# Patient Record
Sex: Female | Born: 1985 | Race: Black or African American | Hispanic: No | Marital: Single | Smoking: Former smoker
Health system: Southern US, Community
[De-identification: ages and names within clinical notes are randomized; demographics above are authoritative.]

---

## 2008-08-07 ENCOUNTER — Emergency Department (HOSPITAL_COMMUNITY): Admission: EM | Admit: 2008-08-07 | Discharge: 2008-08-07 | Payer: Self-pay | Admitting: Emergency Medicine

## 2008-09-02 ENCOUNTER — Emergency Department (HOSPITAL_COMMUNITY): Admission: EM | Admit: 2008-09-02 | Discharge: 2008-09-02 | Payer: Self-pay | Admitting: Family Medicine

## 2008-12-02 ENCOUNTER — Emergency Department (HOSPITAL_COMMUNITY): Admission: EM | Admit: 2008-12-02 | Discharge: 2008-12-02 | Payer: Self-pay | Admitting: Emergency Medicine

## 2009-12-16 ENCOUNTER — Emergency Department (HOSPITAL_COMMUNITY): Admission: EM | Admit: 2009-12-16 | Discharge: 2009-12-16 | Payer: Self-pay | Admitting: Family Medicine

## 2010-07-24 ENCOUNTER — Emergency Department (HOSPITAL_BASED_OUTPATIENT_CLINIC_OR_DEPARTMENT_OTHER): Admission: EM | Admit: 2010-07-24 | Discharge: 2010-07-24 | Payer: Self-pay | Admitting: Emergency Medicine

## 2010-12-25 LAB — URINALYSIS, ROUTINE W REFLEX MICROSCOPIC
Glucose, UA: NEGATIVE mg/dL
Hgb urine dipstick: NEGATIVE
pH: 5 (ref 5.0–8.0)

## 2011-01-18 ENCOUNTER — Emergency Department (HOSPITAL_COMMUNITY)
Admission: EM | Admit: 2011-01-18 | Discharge: 2011-01-18 | Discharge status: Against Medical Advice | Payer: Self-pay | Attending: Emergency Medicine | Admitting: Emergency Medicine

## 2011-01-18 DIAGNOSIS — M542 Cervicalgia: Secondary | ICD-10-CM | POA: Insufficient documentation

## 2011-01-27 LAB — POCT RAPID STREP A (OFFICE): Streptococcus, Group A Screen (Direct): NEGATIVE

## 2011-03-10 ENCOUNTER — Inpatient Hospital Stay (INDEPENDENT_AMBULATORY_CARE_PROVIDER_SITE_OTHER)
Admission: RE | Admit: 2011-03-10 | Discharge: 2011-03-10 | Disposition: A | Discharge status: Home / Self Care | Payer: Self-pay | Source: Ambulatory Visit | Attending: Emergency Medicine | Admitting: Emergency Medicine

## 2011-03-10 DIAGNOSIS — L089 Local infection of the skin and subcutaneous tissue, unspecified: Secondary | ICD-10-CM

## 2011-03-13 LAB — WOUND CULTURE
Culture: NO GROWTH
Gram Stain: NONE SEEN

## 2011-07-14 LAB — POCT URINALYSIS DIP (DEVICE)
Hgb urine dipstick: NEGATIVE
Ketones, ur: NEGATIVE
Protein, ur: NEGATIVE
Specific Gravity, Urine: 1.02
pH: 6.5

## 2011-12-21 ENCOUNTER — Encounter (HOSPITAL_COMMUNITY): Payer: Self-pay

## 2011-12-21 ENCOUNTER — Emergency Department (HOSPITAL_COMMUNITY)
Admission: EM | Admit: 2011-12-21 | Discharge: 2011-12-21 | Disposition: A | Discharge status: Home Health | Payer: Managed Care, Other (non HMO) | Source: Home / Self Care

## 2011-12-21 DIAGNOSIS — K529 Noninfective gastroenteritis and colitis, unspecified: Secondary | ICD-10-CM

## 2011-12-21 DIAGNOSIS — K5289 Other specified noninfective gastroenteritis and colitis: Secondary | ICD-10-CM

## 2011-12-21 DIAGNOSIS — J069 Acute upper respiratory infection, unspecified: Secondary | ICD-10-CM

## 2011-12-21 MED ORDER — PROMETHAZINE-CODEINE 6.25-10 MG/5ML PO SYRP: ORAL_SOLUTION | ORAL | Status: AC

## 2011-12-21 NOTE — ED Provider Notes (Signed)
History     CSN: [id number]  Arrival date & time 12/21/11  1021   None     Chief Complaint  Patient presents with  . URI    (Consider location/radiation/quality/duration/timing/severity/associated sxs/prior treatment) HPI Comments: Patient states that she began 4 days ago with clear rhinorrhea and nonproductive cough. She has also had sore throat, headache and body aches. Early this morning she began with vomiting and diarrhea. She has vomited once and has had 2 loose stools. No abdominal pain, but states stomach "doesn't feel right." She denies any known sick contacts. She has tried various over-the-counter cough and cold products without any improvement. She is eating and drinking normally. She is urinating normally without any dysuria. Last menstrual period was 2 weeks ago and was normal.   Past Medical History  Diagnosis Date  . Asthma     History reviewed. No pertinent past surgical history.  History reviewed. No pertinent family history.  History  Substance Use Topics  . Smoking status: Current Everyday Smoker  . Smokeless tobacco: Not on file  . Alcohol Use: Yes    OB History    Grav Para Term Preterm Abortions TAB SAB Ect Mult Living                  Review of Systems  Constitutional: Positive for fatigue. Negative for fever and chills.  HENT: Positive for congestion, sore throat and rhinorrhea. Negative for ear pain, postnasal drip and sinus pressure.   Respiratory: Positive for cough. Negative for shortness of breath and wheezing.   Cardiovascular: Negative for chest pain.  Gastrointestinal: Positive for nausea, vomiting and diarrhea. Negative for abdominal pain.    Allergies  Penicillins  Home Medications   Current Outpatient Rx  Name Route Sig Dispense Refill  . ALBUTEROL IN Inhalation Inhale into the lungs.    Marland Kitchen PROMETHAZINE-CODEINE 6.25-10 MG/5ML PO SYRP  1-2 tsp every 6 hrs prn cough 120 mL 0    BP 113/71  Pulse 108  Temp(Src) 99.1 F (37.3  C) (Oral)  Resp 20  SpO2 100%  LMP 12/07/2011  Physical Exam  Nursing note and vitals reviewed. Constitutional: She appears well-developed and well-nourished. No distress.  HENT:  Head: Normocephalic and atraumatic.  Right Ear: Tympanic membrane, external ear and ear canal normal.  Left Ear: Tympanic membrane, external ear and ear canal normal.  Nose: Nose normal.  Mouth/Throat: Uvula is midline, oropharynx is clear and moist and mucous membranes are normal. No oropharyngeal exudate, posterior oropharyngeal edema or posterior oropharyngeal erythema.  Neck: Neck supple.  Cardiovascular: Normal rate, regular rhythm and normal heart sounds.   Pulmonary/Chest: Effort normal and breath sounds normal. No respiratory distress.  Abdominal: Soft. Bowel sounds are normal. She exhibits no distension and no mass. There is no tenderness.  Lymphadenopathy:    She has no cervical adenopathy.  Neurological: She is alert.  Skin: Skin is warm and dry.  Psychiatric: She has a normal mood and affect.    ED Course  Procedures (including critical care time)  Labs Reviewed - No data to display No results found.   1. Acute URI   2. Gastroenteritis       MDM  Viral URI symptoms x 5 days with onset of V/D today. Retaining foods and fluids.         Melody Comas, [place] 12/21/11 1255

## 2011-12-21 NOTE — ED Notes (Signed)
C/o sore throat, runny nose, nasal congestion, headache, cough, diarrhea and vomiting since last Thursday.  States drinking fluids and retaining them.

## 2011-12-21 NOTE — ED Provider Notes (Signed)
Medical screening examination/treatment/procedure(s) were performed by non-physician practitioner and as supervising physician I was immediately available for consultation/collaboration.  Eagan Shifflett M. MD   Robertta Halfhill M Lashundra Shiveley, MD 12/21/11 1331 

## 2011-12-21 NOTE — Discharge Instructions (Signed)
Tylenol or Ibuprofen as needed for discomfort. Drink clear fluids, and eat a light bland diet until your vomiting and diarrhea improve.  Return if symptoms change or worsen.

## 2012-01-21 ENCOUNTER — Encounter: Payer: Managed Care, Other (non HMO) | Admitting: Physician Assistant

## 2012-06-28 NOTE — Progress Notes (Signed)
This encounter was created in error - please disregard.

## 2012-10-19 ENCOUNTER — Telehealth: Payer: Self-pay | Admitting: Obstetrics and Gynecology

## 2012-10-19 NOTE — Telephone Encounter (Signed)
Tc to pt per telephone call. Pt c/o brown vag d/c with odor. Mild itching. Appt sched 10/20/12 @ 11:30p with AVS for eval. Pt agrees.

## 2012-10-20 ENCOUNTER — Encounter: Payer: Self-pay | Admitting: Obstetrics and Gynecology

## 2012-10-20 ENCOUNTER — Ambulatory Visit (INDEPENDENT_AMBULATORY_CARE_PROVIDER_SITE_OTHER): Payer: Managed Care, Other (non HMO) | Admitting: Obstetrics and Gynecology

## 2012-10-20 VITALS — BP 106/70 | Wt 164.0 lb

## 2012-10-20 DIAGNOSIS — Z113 Encounter for screening for infections with a predominantly sexual mode of transmission: Secondary | ICD-10-CM

## 2012-10-20 DIAGNOSIS — N898 Other specified noninflammatory disorders of vagina: Secondary | ICD-10-CM

## 2012-10-20 LAB — POCT WET PREP (WET MOUNT)
Bacteria Wet Prep HPF POC: NEGATIVE
Clue Cells Wet Prep Whiff POC: POSITIVE
WBC, Wet Prep HPF POC: NEGATIVE
pH: 5

## 2012-10-20 MED ORDER — METRONIDAZOLE 0.75 % VA GEL: 1.0000 | Freq: Two times a day (BID) | VAGINAL | Status: DC

## 2012-10-20 MED ORDER — METRONIDAZOLE 500 MG PO TABS: 500.0000 mg | ORAL_TABLET | Freq: Two times a day (BID) | ORAL | Status: DC

## 2012-10-20 NOTE — Progress Notes (Signed)
HISTORY OF PRESENT ILLNESS  [name] is a 27 y.o. year old female,No obstetric history on file., who presents for a problem visit. The patient has had a new sex partner.  She was to rule out sexual transmitted infections.  Subjective:  The patient complains of a brown discharge with a holder.  Objective:  BP 106/70  Wt 164 lb (74.39 kg)  LMP 10/05/2012   General: no distress GI: soft and nontender  External genitalia: normal general appearance Vaginal: normal without tenderness, induration or masses Cervix: normal appearance Adnexa: normal bimanual exam Uterus: normal size and shape  Wet prep: Clue cells negative, pH 5.5, Whiff positive  Assessment:  Vaginosis  Rule out sexual transmitted infections  Plan:  MetroGel vaginal 0.75% twice each day for 5 days or Metronidazole 500 mg 1 tablet twice a day for 7 days  Gonorrhea and Chlamydia sent  Return to office prn if symptoms worsen or fail to improve.   Leonard Schwartz M.D.  10/20/2012 9:21 PM    Color: Manson Passey Odor: yes Itching:yes Thin:yes Thick:no Fever:no Dyspareunia:no Hx PID:no HX STD:yes Pelvic Pain:no Desires Gc/CT:yes Desires HIV,RPR,HbsAG:yes

## 2012-10-21 LAB — GC/CHLAMYDIA PROBE AMP: CT Probe RNA: NEGATIVE

## 2012-11-19 ENCOUNTER — Emergency Department (INDEPENDENT_AMBULATORY_CARE_PROVIDER_SITE_OTHER): Payer: Managed Care, Other (non HMO)

## 2012-11-19 ENCOUNTER — Emergency Department (HOSPITAL_COMMUNITY)
Admission: EM | Admit: 2012-11-19 | Discharge: 2012-11-19 | Disposition: A | Discharge status: Home / Self Care | Payer: Managed Care, Other (non HMO) | Source: Home / Self Care | Attending: Family Medicine | Admitting: Family Medicine

## 2012-11-19 ENCOUNTER — Encounter (HOSPITAL_COMMUNITY): Payer: Self-pay | Admitting: Emergency Medicine

## 2012-11-19 DIAGNOSIS — S5290XA Unspecified fracture of unspecified forearm, initial encounter for closed fracture: Secondary | ICD-10-CM

## 2012-11-19 DIAGNOSIS — S62009A Unspecified fracture of navicular [scaphoid] bone of unspecified wrist, initial encounter for closed fracture: Secondary | ICD-10-CM

## 2012-11-19 DIAGNOSIS — S5291XA Unspecified fracture of right forearm, initial encounter for closed fracture: Secondary | ICD-10-CM

## 2012-11-19 DIAGNOSIS — S62001A Unspecified fracture of navicular [scaphoid] bone of right wrist, initial encounter for closed fracture: Secondary | ICD-10-CM

## 2012-11-19 MED ORDER — IBUPROFEN 800 MG PO TABS
800.0000 mg | ORAL_TABLET | Freq: Once | ORAL | Status: AC
  Administered 2012-11-19: 800 mg via ORAL

## 2012-11-19 MED ORDER — HYDROCODONE-ACETAMINOPHEN 5-325 MG PO TABS
1.0000 | ORAL_TABLET | Freq: Once | ORAL | Status: AC
  Administered 2012-11-19: 1 via ORAL

## 2012-11-19 MED ORDER — HYDROCODONE-ACETAMINOPHEN 5-325 MG PO TABS
ORAL_TABLET | ORAL | Status: AC
  Filled 2012-11-19: qty 1

## 2012-11-19 MED ORDER — IBUPROFEN 800 MG PO TABS
ORAL_TABLET | ORAL | Status: AC
  Filled 2012-11-19: qty 1

## 2012-11-19 MED ORDER — HYDROCODONE-ACETAMINOPHEN 5-325 MG PO TABS: 1.0000 | ORAL_TABLET | ORAL | Status: DC | PRN

## 2012-11-19 NOTE — ED Notes (Signed)
Pt states that she injured her right wrist from punching the wall a couple of times Wednesday night. Pt in unable to flex/extend wrist. Hurts with movement. Some swelling noted.  Pt has used ibuprofen and ice for reilift.

## 2012-11-19 NOTE — ED Provider Notes (Signed)
History     CSN: [id number]  Arrival date & time 11/19/12  1233   First MD Initiated Contact with Patient 11/19/12 1340      Chief Complaint  Patient presents with  . Wrist Injury    right wrist injury from punching wall    HPI: Patient is a 27 y.o. female presenting with wrist injury. The history is provided by the patient.  Wrist Injury Location:  Wrist Time since incident:  3 days Injury: yes   Wrist location:  R wrist Pt states she struck a wall Wednesday night and has had increasing pain and swelling to her (R) wrist since. Pt is (R) handed.   Past Medical History  Diagnosis Date  . Asthma     History reviewed. No pertinent past surgical history.  History reviewed. No pertinent family history.  History  Substance Use Topics  . Smoking status: Current Every Day Smoker  . Smokeless tobacco: Not on file  . Alcohol Use: Yes    OB History   Grav Para Term Preterm Abortions TAB SAB Ect Mult Living                  Review of Systems  All other systems reviewed and are negative.    Allergies  Penicillins  Home Medications   Current Outpatient Rx  Name  Route  Sig  Dispense  Refill  . ALBUTEROL IN   Inhalation   Inhale into the lungs.         Marland Kitchen etonogestrel-ethinyl estradiol (NUVARING) 0.12-0.015 MG/24HR vaginal ring   Vaginal   Place 1 each vaginally every 28 (twenty-eight) days. Insert vaginally and leave in place for 3 consecutive weeks, then remove for 1 week.         . metroNIDAZOLE (FLAGYL) 500 MG tablet   Oral   Take 1 tablet (500 mg total) by mouth 2 (two) times daily with a meal.   14 tablet   0   . metroNIDAZOLE (METROGEL VAGINAL) 0.75 % vaginal gel   Vaginal   Place 1 Applicatorful vaginally 2 (two) times daily.   70 g   0     BP 117/70  Pulse 86  Temp(Src) 97.6 F (36.4 C) (Oral)  SpO2 100%  LMP 11/11/2012  Physical Exam  Constitutional: She is oriented to person, place, and time. She appears well-developed and  well-nourished.  HENT:  Head: Normocephalic and atraumatic.  Eyes: Conjunctivae are normal.  Neck: Neck supple.  Cardiovascular: Normal rate.   Pulmonary/Chest: Effort normal.  Abdominal: Soft.  Musculoskeletal:       Right wrist: She exhibits decreased range of motion, tenderness, bony tenderness and swelling.  Unable to appreciate obvious deformity due to swelling. Good (R) radial pulse. Color normal, (R) hand warm. Gross swelling to (R) wrist posterior > anterior. No open wounds.   Neurological: She is alert and oriented to person, place, and time.  Skin: Skin is warm and dry.    ED Course  Procedures   1525:  Spoke w/ Dr Orlan Leavens who agreed to come see pt. After discussing options w/ pt it was decided she would return tomorrow for surgery. Dr Orlan Leavens called ortho tech who came to [facility] and placed splint to (R) wrist.forearm.   Labs Reviewed - No data to display Dg Wrist Complete Right  11/19/2012  *RADIOLOGY REPORT*  Clinical Data: Punched wall with pain  RIGHT WRIST - COMPLETE 3+ VIEW  Comparison: None.  Findings: There is a transverse impacted fracture of  the distal right radius which extends intra-articular.  A tiny fracture fragment from the ulnar styloid also is noted.  The carpal bones are in normal position.  IMPRESSION: Impacted transverse fracture of the distal right radius extending intra-articular.  Small fragment from the ulnar styloid as well.   Original Report Authenticated By: Dwyane Dee, M.D.      No diagnosis found.    MDM  Injury to (R) wrist 2.5 days ago. Imaging reveals impacted radial fx. Dr Orlan Leavens in to see. Splint/sling placed per his direction by ortho techs. Will d/c home w/ short course of med for pain. Pt is to return in am for surgery at [facility].        Leanne Chang, NP 11/19/12 1625

## 2012-11-19 NOTE — Progress Notes (Signed)
Orthopedic Tech Progress Note Patient Details:  Cassandra [name] 1986/09/29 [id number]  Ortho Devices Type of Ortho Device: Post (short arm) splint Ortho Device/Splint Location: RIGHT SUGAR TONG Ortho Device/Splint Interventions: Application   Cammer, Mickie Bail 11/19/2012, 4:28 PM

## 2012-11-20 ENCOUNTER — Encounter (HOSPITAL_COMMUNITY): Payer: Self-pay | Admitting: Emergency Medicine

## 2012-11-20 ENCOUNTER — Observation Stay (HOSPITAL_COMMUNITY)
Admission: EM | Admit: 2012-11-20 | Discharge: 2012-11-21 | Disposition: A | Discharge status: Home / Self Care | Payer: Managed Care, Other (non HMO) | Source: Ambulatory Visit | Attending: Orthopedic Surgery | Admitting: Orthopedic Surgery

## 2012-11-20 ENCOUNTER — Emergency Department (HOSPITAL_COMMUNITY): Payer: Managed Care, Other (non HMO) | Admitting: Anesthesiology

## 2012-11-20 ENCOUNTER — Encounter (HOSPITAL_COMMUNITY): Payer: Self-pay | Admitting: Anesthesiology

## 2012-11-20 ENCOUNTER — Encounter (HOSPITAL_COMMUNITY): Admission: EM | Disposition: A | Discharge status: Home / Self Care | Payer: Self-pay | Source: Ambulatory Visit

## 2012-11-20 DIAGNOSIS — S52599A Other fractures of lower end of unspecified radius, initial encounter for closed fracture: Principal | ICD-10-CM | POA: Insufficient documentation

## 2012-11-20 DIAGNOSIS — F17200 Nicotine dependence, unspecified, uncomplicated: Secondary | ICD-10-CM | POA: Insufficient documentation

## 2012-11-20 DIAGNOSIS — W1809XA Striking against other object with subsequent fall, initial encounter: Secondary | ICD-10-CM | POA: Insufficient documentation

## 2012-11-20 DIAGNOSIS — J45909 Unspecified asthma, uncomplicated: Secondary | ICD-10-CM | POA: Insufficient documentation

## 2012-11-20 HISTORY — PX: CLOSED REDUCTION WRIST FRACTURE: SHX1091

## 2012-11-20 HISTORY — PX: ORIF WRIST FRACTURE: SHX2133

## 2012-11-20 SURGERY — CLOSED REDUCTION, WRIST
Anesthesia: General | Site: Wrist | Laterality: Right | Wound class: Clean

## 2012-11-20 MED ORDER — BUPIVACAINE-EPINEPHRINE PF 0.5-1:200000 % IJ SOLN
INTRAMUSCULAR | Status: DC | PRN
  Administered 2012-11-20: 150 mg

## 2012-11-20 MED ORDER — CEFAZOLIN SODIUM-DEXTROSE 2-3 GM-% IV SOLR
INTRAVENOUS | Status: AC
  Filled 2012-11-20: qty 50

## 2012-11-20 MED ORDER — HYDROMORPHONE HCL PF 1 MG/ML IJ SOLN: 0.5000 mg | INTRAMUSCULAR | Status: DC | PRN

## 2012-11-20 MED ORDER — HYDROMORPHONE HCL PF 1 MG/ML IJ SOLN: 0.2500 mg | INTRAMUSCULAR | Status: DC | PRN

## 2012-11-20 MED ORDER — ONDANSETRON HCL 4 MG/2ML IJ SOLN
INTRAMUSCULAR | Status: DC | PRN
  Administered 2012-11-20: 4 mg via INTRAVENOUS

## 2012-11-20 MED ORDER — DOCUSATE SODIUM 100 MG PO CAPS
100.0000 mg | ORAL_CAPSULE | Freq: Two times a day (BID) | ORAL | Status: DC
  Administered 2012-11-20: 100 mg via ORAL
  Filled 2012-11-20 (×2): qty 1

## 2012-11-20 MED ORDER — OXYCODONE-ACETAMINOPHEN 5-325 MG PO TABS: 1.0000 | ORAL_TABLET | ORAL | Status: DC | PRN

## 2012-11-20 MED ORDER — ONDANSETRON HCL 4 MG PO TABS: 4.0000 mg | ORAL_TABLET | Freq: Four times a day (QID) | ORAL | Status: DC | PRN

## 2012-11-20 MED ORDER — OXYCODONE HCL 5 MG/5ML PO SOLN: 5.0000 mg | Freq: Once | ORAL | Status: DC | PRN

## 2012-11-20 MED ORDER — NICOTINE 14 MG/24HR TD PT24
14.0000 mg | MEDICATED_PATCH | Freq: Every day | TRANSDERMAL | Status: DC
  Administered 2012-11-20 – 2012-11-21 (×2): 14 mg via TRANSDERMAL
  Filled 2012-11-20 (×2): qty 1

## 2012-11-20 MED ORDER — CEFAZOLIN SODIUM 1-5 GM-% IV SOLN
1.0000 g | Freq: Three times a day (TID) | INTRAVENOUS | Status: AC
  Administered 2012-11-20 – 2012-11-21 (×2): 1 g via INTRAVENOUS
  Filled 2012-11-20 (×2): qty 50

## 2012-11-20 MED ORDER — CEFAZOLIN SODIUM 1-5 GM-% IV SOLN
1.0000 g | INTRAVENOUS | Status: AC
  Administered 2012-11-20: 1 g via INTRAVENOUS
  Filled 2012-11-20: qty 50

## 2012-11-20 MED ORDER — OXYCODONE HCL 5 MG PO TABS
5.0000 mg | ORAL_TABLET | Freq: Once | ORAL | Status: DC | PRN
Start: 2012-11-20 — End: 2012-11-20

## 2012-11-20 MED ORDER — PROPOFOL 10 MG/ML IV BOLUS
INTRAVENOUS | Status: DC | PRN
  Administered 2012-11-20: 200 mg via INTRAVENOUS

## 2012-11-20 MED ORDER — METHOCARBAMOL 500 MG PO TABS
500.0000 mg | ORAL_TABLET | Freq: Four times a day (QID) | ORAL | Status: DC | PRN
  Administered 2012-11-21: 500 mg via ORAL
  Filled 2012-11-20: qty 1

## 2012-11-20 MED ORDER — CHLORHEXIDINE GLUCONATE 4 % EX LIQD
60.0000 mL | Freq: Once | CUTANEOUS | Status: DC
  Filled 2012-11-20: qty 60

## 2012-11-20 MED ORDER — OXYCODONE-ACETAMINOPHEN 5-325 MG PO TABS
1.0000 | ORAL_TABLET | ORAL | Status: DC | PRN
  Administered 2012-11-21: 2 via ORAL
  Filled 2012-11-20: qty 2

## 2012-11-20 MED ORDER — CEFAZOLIN SODIUM-DEXTROSE 2-3 GM-% IV SOLR: 2.0000 g | INTRAVENOUS | Status: DC

## 2012-11-20 MED ORDER — ONDANSETRON HCL 4 MG/2ML IJ SOLN: 4.0000 mg | Freq: Four times a day (QID) | INTRAMUSCULAR | Status: DC | PRN

## 2012-11-20 MED ORDER — ZOLPIDEM TARTRATE 5 MG PO TABS: 5.0000 mg | ORAL_TABLET | Freq: Every evening | ORAL | Status: DC | PRN

## 2012-11-20 MED ORDER — PROMETHAZINE HCL 25 MG/ML IJ SOLN: 6.2500 mg | INTRAMUSCULAR | Status: DC | PRN

## 2012-11-20 MED ORDER — DEXTROSE 5 % IV SOLN
500.0000 mg | Freq: Four times a day (QID) | INTRAVENOUS | Status: DC | PRN
  Filled 2012-11-20: qty 5

## 2012-11-20 MED ORDER — FENTANYL CITRATE 0.05 MG/ML IJ SOLN
INTRAMUSCULAR | Status: DC | PRN
  Administered 2012-11-20: 100 ug via INTRAVENOUS
  Administered 2012-11-20: 50 ug via INTRAVENOUS

## 2012-11-20 MED ORDER — METHOCARBAMOL 500 MG PO TABS: 500.0000 mg | ORAL_TABLET | Freq: Four times a day (QID) | ORAL | Status: DC

## 2012-11-20 MED ORDER — 0.9 % SODIUM CHLORIDE (POUR BTL) OPTIME
TOPICAL | Status: DC | PRN
  Administered 2012-11-20: 1000 mL

## 2012-11-20 MED ORDER — MIDAZOLAM HCL 5 MG/5ML IJ SOLN
INTRAMUSCULAR | Status: DC | PRN
  Administered 2012-11-20: 2 mg via INTRAVENOUS

## 2012-11-20 MED ORDER — HYDROCODONE-ACETAMINOPHEN 5-325 MG PO TABS
1.0000 | ORAL_TABLET | ORAL | Status: DC | PRN
  Administered 2012-11-21 (×2): 2 via ORAL
  Filled 2012-11-20 (×2): qty 2

## 2012-11-20 MED ORDER — VITAMIN C 500 MG PO TABS
1000.0000 mg | ORAL_TABLET | Freq: Every day | ORAL | Status: DC
  Filled 2012-11-20 (×2): qty 2

## 2012-11-20 MED ORDER — LACTATED RINGERS IV SOLN
INTRAVENOUS | Status: DC | PRN
  Administered 2012-11-20 (×2): via INTRAVENOUS

## 2012-11-20 MED ORDER — KCL IN DEXTROSE-NACL 20-5-0.45 MEQ/L-%-% IV SOLN
INTRAVENOUS | Status: DC
  Filled 2012-11-20 (×2): qty 1000

## 2012-11-20 MED ORDER — DEXAMETHASONE SODIUM PHOSPHATE 4 MG/ML IJ SOLN
INTRAMUSCULAR | Status: DC | PRN
  Administered 2012-11-20: 10 mg

## 2012-11-20 MED ORDER — BUPIVACAINE HCL (PF) 0.25 % IJ SOLN
INTRAMUSCULAR | Status: AC
  Filled 2012-11-20: qty 30

## 2012-11-20 MED ORDER — DIPHENHYDRAMINE HCL 25 MG PO CAPS: 25.0000 mg | ORAL_CAPSULE | Freq: Four times a day (QID) | ORAL | Status: DC | PRN

## 2012-11-20 MED ORDER — DOCUSATE SODIUM 100 MG PO CAPS: 100.0000 mg | ORAL_CAPSULE | Freq: Two times a day (BID) | ORAL | Status: DC

## 2012-11-20 SURGICAL SUPPLY — 42 items
BANDAGE ELASTIC 3 VELCRO ST LF (GAUZE/BANDAGES/DRESSINGS) ×2 IMPLANT
BANDAGE ELASTIC 4 VELCRO ST LF (GAUZE/BANDAGES/DRESSINGS) ×2 IMPLANT
BANDAGE GAUZE ELAST BULKY 4 IN (GAUZE/BANDAGES/DRESSINGS) ×2 IMPLANT
BIT DRILL 2 FAST STEP (BIT) ×2 IMPLANT
BIT DRILL 2.5X4 QC (BIT) ×2 IMPLANT
BNDG ESMARK 4X9 LF (GAUZE/BANDAGES/DRESSINGS) ×2 IMPLANT
CLOTH BEACON ORANGE TIMEOUT ST (SAFETY) ×2 IMPLANT
CORDS BIPOLAR (ELECTRODE) ×2 IMPLANT
COVER SURGICAL LIGHT HANDLE (MISCELLANEOUS) ×2 IMPLANT
CUFF TOURNIQUET SINGLE 18IN (TOURNIQUET CUFF) ×2 IMPLANT
DRAPE SURG 17X23 STRL (DRAPES) ×2 IMPLANT
DRSG EMULSION OIL 3X3 NADH (GAUZE/BANDAGES/DRESSINGS) ×2 IMPLANT
GLOVE BIOGEL PI IND STRL 8.5 (GLOVE) ×1 IMPLANT
GLOVE BIOGEL PI INDICATOR 8.5 (GLOVE) ×1
GLOVE SURG ORTHO 8.0 STRL STRW (GLOVE) ×2 IMPLANT
GOWN PREVENTION PLUS XLARGE (GOWN DISPOSABLE) ×2 IMPLANT
GOWN STRL NON-REIN LRG LVL3 (GOWN DISPOSABLE) ×2 IMPLANT
KIT BASIN OR (CUSTOM PROCEDURE TRAY) ×2 IMPLANT
KIT ROOM TURNOVER OR (KITS) ×2 IMPLANT
NS IRRIG 1000ML POUR BTL (IV SOLUTION) ×2 IMPLANT
PACK ORTHO EXTREMITY (CUSTOM PROCEDURE TRAY) ×2 IMPLANT
PAD ARMBOARD 7.5X6 YLW CONV (MISCELLANEOUS) ×2 IMPLANT
PAD CAST 4YDX4 CTTN HI CHSV (CAST SUPPLIES) ×1 IMPLANT
PADDING CAST COTTON 4X4 STRL (CAST SUPPLIES) ×1
PEG SUBCHONDRAL SMOOTH 2.0X16 (Peg) ×2 IMPLANT
PEG SUBCHONDRAL SMOOTH 2.0X18 (Peg) ×2 IMPLANT
PEG SUBCHONDRAL SMOOTH 2.0X20 (Peg) ×10 IMPLANT
PLATE SHORT 24.4X51.3 RT (Plate) ×2 IMPLANT
SCREW BN 12X3.5XNS CORT TI (Screw) ×2 IMPLANT
SCREW BN 13X3.5XNS CORT TI (Screw) ×1 IMPLANT
SCREW CORT 3.5X12 (Screw) ×2 IMPLANT
SCREW CORT 3.5X13 (Screw) ×1 IMPLANT
SOAP 2 % CHG 4 OZ (WOUND CARE) ×2 IMPLANT
SPLINT FIBERGLASS 3X35 (CAST SUPPLIES) ×2 IMPLANT
SPONGE GAUZE 4X4 12PLY (GAUZE/BANDAGES/DRESSINGS) ×2 IMPLANT
SUCTION FRAZIER TIP 10 FR DISP (SUCTIONS) ×2 IMPLANT
SUT PROLENE 4 0 PS 2 18 (SUTURE) ×2 IMPLANT
SUT VIC AB 2-0 FS1 27 (SUTURE) ×2 IMPLANT
SUT VICRYL 4-0 PS2 18IN ABS (SUTURE) ×2 IMPLANT
SYR CONTROL 10ML LL (SYRINGE) ×4 IMPLANT
TOWEL OR 17X26 10 PK STRL BLUE (TOWEL DISPOSABLE) ×2 IMPLANT
TUBE CONNECTING 12X1/4 (SUCTIONS) ×2 IMPLANT

## 2012-11-20 NOTE — Progress Notes (Signed)
Utilization Review Completed.Cassandra [name] T2/06/2013  

## 2012-11-20 NOTE — ED Notes (Signed)
Pt here for pre op labs; pt with have right wrist sx with Dr Orlan Leavens today

## 2012-11-20 NOTE — Addendum Note (Signed)
Addendum created 11/20/12 1352 by Fabienne Bruns, RN   Modules edited: Anesthesia Responsible Staff

## 2012-11-20 NOTE — Addendum Note (Signed)
Addendum created 11/20/12 1346 by Fabienne Bruns, RN   Modules edited: Anesthesia Responsible Staff

## 2012-11-20 NOTE — Anesthesia Postprocedure Evaluation (Signed)
  Anesthesia Post-op Note  Patient: Cassandra [name]  Procedure(s) Performed: Procedure(s): attempted CLOSED REDUCTION WRIST; converted to Open reduction internal fixation (Right) OPEN REDUCTION INTERNAL FIXATION (ORIF) WRIST FRACTURE (Right)  Patient Location: PACU  Anesthesia Type:General  Level of Consciousness: awake and alert   Airway and Oxygen Therapy: Patient Spontanous Breathing  Post-op Pain: none  Post-op Assessment: Post-op Vital signs reviewed, Patient's Cardiovascular Status Stable and Respiratory Function Stable  Post-op Vital Signs: Reviewed and stable  Complications: No apparent anesthesia complications

## 2012-11-20 NOTE — Anesthesia Procedure Notes (Addendum)
Anesthesia Regional Block:  Supraclavicular block  Pre-Anesthetic Checklist: ,, timeout performed, Correct Patient, Correct Site, Correct Laterality, Correct Procedure, Correct Position, site marked, Risks and benefits discussed,  Surgical consent,  Pre-op evaluation,  At surgeon's request and post-op pain management  Laterality: Right  Prep: chloraprep       Needles:  Injection technique: Single-shot  Needle Type: Echogenic Stimulator Needle     Needle Length: 5cm 5 cm Needle Gauge: 22 and 22 G    Additional Needles:  Procedures: ultrasound guided (picture in chart) and nerve stimulator Supraclavicular block  Nerve Stimulator or Paresthesia:  Response: bicep contraction, 0.45 mA,   Additional Responses:   Narrative:  Start time: 11/20/2012 9:44 AM End time: 11/20/2012 9:50 AM Injection made incrementally with aspirations every 5 mL.  Performed by: Personally  Anesthesiologist: J. Adonis Huguenin, MD  Additional Notes: Functioning IV was confirmed and monitors applied.  A 50mm 22ga echogenic arrow stimulator was used. Sterile prep and drape,hand hygiene and sterile gloves were used.Ultrasound guidance: relevant anatomy identified, needle position confirmed, local anesthetic spread visualized around nerve(s)., vascular puncture avoided.  Image printed for medical record.  Negative aspiration and negative test dose prior to incremental administration of local anesthetic. The patient tolerated the procedure well.  Interscalene brachial plexus block Procedure Name: LMA Insertion Date/Time: 11/20/2012 10:45 AM Performed by: Rogelia Boga Pre-anesthesia Checklist: Patient identified, Emergency Drugs available, Suction available, Patient being monitored and Timeout performed Patient Re-evaluated:Patient Re-evaluated prior to inductionOxygen Delivery Method: Circle system utilized Preoxygenation: Pre-oxygenation with 100% oxygen Intubation Type: IV induction LMA: LMA inserted LMA  Size: 4.0 Tube type: Oral Number of attempts: 1 Placement Confirmation: positive ETCO2 and breath sounds checked- equal and bilateral Tube secured with: Tape Dental Injury: Teeth and Oropharynx as per pre-operative assessment

## 2012-11-20 NOTE — Preoperative (Signed)
Beta Blockers   Reason not to administer Beta Blockers:Not Applicable 

## 2012-11-20 NOTE — H&P (Signed)
Cassandra [name] is an 27 y.o. female.   Chief Complaint: RIGHT WRIST INJURY  HPI: PT HIT STATIONARY OBJECT SEVERAL DAYS AGO, SUSTAINED INJURY TO RIGHT WRIST PT PRESENTED TO URGENT CARE YESTERDAY WITH PAIN AND SWELLING TO RIGHT WRIST PT SEEN AND EVALUATED IN URGENT CARE YESTERDAY PT RECOMMENDED TO UNDERGO CLOSED MANIPULATION AND LIKELY PINNING FOR ANGULATED DISTAL RADIUS FRACTURE POSSIBLE OPEN REDUCTION AND INTERNAL FIXATION  Past Medical History  Diagnosis Date  . Asthma     No past surgical history on file.  No family history on file. Social History:  reports that she has been smoking.  She does not have any smokeless tobacco history on file. She reports that  drinks alcohol. She reports that she does not use illicit drugs.  Allergies:  Allergies  Allergen Reactions  . Penicillins     Medications Prior to Admission  Medication Sig Dispense Refill  . ALBUTEROL IN Inhale into the lungs.      Marland Kitchen etonogestrel-ethinyl estradiol (NUVARING) 0.12-0.015 MG/24HR vaginal ring Place 1 each vaginally every 28 (twenty-eight) days. Insert vaginally and leave in place for 3 consecutive weeks, then remove for 1 week.      Marland Kitchen HYDROcodone-acetaminophen (NORCO/VICODIN) 5-325 MG per tablet Take 1 tablet by mouth every 4 (four) hours as needed for pain.  10 tablet  0  . metroNIDAZOLE (FLAGYL) 500 MG tablet Take 1 tablet (500 mg total) by mouth 2 (two) times daily with a meal.  14 tablet  0  . metroNIDAZOLE (METROGEL VAGINAL) 0.75 % vaginal gel Place 1 Applicatorful vaginally 2 (two) times daily.  70 g  0    No results found for this or any previous visit (from the past 48 hour(s)). Dg Wrist Complete Right  11/19/2012  *RADIOLOGY REPORT*  Clinical Data: Punched wall with pain  RIGHT WRIST - COMPLETE 3+ VIEW  Comparison: None.  Findings: There is a transverse impacted fracture of the distal right radius which extends intra-articular.  A tiny fracture fragment from the ulnar styloid also is noted.  The carpal  bones are in normal position.  IMPRESSION: Impacted transverse fracture of the distal right radius extending intra-articular.  Small fragment from the ulnar styloid as well.   Original Report Authenticated By: Dwyane Dee, M.D.     NO RECENT ILLNESSES OR HOSPITALIZATIONS  Last menstrual period 11/11/2012. General Appearance:  Alert, cooperative, no distress, appears stated age  Head:  Normocephalic, without obvious abnormality, atraumatic  Eyes:  Pupils equal, conjunctiva/corneas clear,         Throat: Lips, mucosa, and tongue normal; teeth and gums normal  Neck: No visible masses     Lungs:   respirations unlabored  Chest Wall:  No tenderness or deformity  Heart:  Regular rate and rhythm,  Abdomen:   Soft, non-tender,         Extremities: RIGHT WRIST: SKIN INTACT FINGERS WARM WELL PERFUSED. GOOD DIGITAL MOTION LIMITED WRIST AND FOREARM ROTATION ABLE TO EXTEND THUMB    Pulses: 2+ and symmetric  Skin: Skin color, texture, turgor normal, no rashes or lesions     Neurologic: Normal    Assessment/Plan RIGHT DISTAL RADIUS FRACTURE WITH DISPLACEMENT AND ANGULATION  RIGHT DISTAL RADIUS CLOSED MANIPULATION AND PINNING POSSIBLE OPEN REDUCTION AND INTERNAL FIXATION.  R/B/A DISCUSSED WITH PT IN URGENT CARE.  PT VOICED UNDERSTANDING OF PLAN CONSENT SIGNED DAY OF SURGERY PT SEEN AND EXAMINED PRIOR TO OPERATIVE PROCEDURE/DAY OF SURGERY SITE MARKED. QUESTIONS ANSWERED WILL GO HOME IF PINNING, LIKELY 23 HOUR OBSERVATION  IF OPEN REDUCTION AND INTERNAL FIXATION FOLLOWING SURGERY  Sharma Covert 11/20/2012, 10:00AM

## 2012-11-20 NOTE — Anesthesia Preprocedure Evaluation (Addendum)
Anesthesia Evaluation  Patient identified by MRN, date of birth, ID band Patient awake    Reviewed: Allergy & Precautions, H&P , NPO status , Patient's Chart, lab work & pertinent test results  Airway Mallampati: II TM Distance: >3 FB Neck ROM: Full    Dental  (+) Teeth Intact and Dental Advisory Given   Pulmonary asthma , Current Smoker,    Pulmonary exam normal       Cardiovascular negative cardio ROS      Neuro/Psych negative neurological ROS     GI/Hepatic negative GI ROS, Neg liver ROS,   Endo/Other  negative endocrine ROS  Renal/GU negative Renal ROS     Musculoskeletal   Abdominal   Peds  Hematology negative hematology ROS (+)   Anesthesia Other Findings   Reproductive/Obstetrics                         Anesthesia Physical Anesthesia Plan  ASA: II  Anesthesia Plan: General   Post-op Pain Management:    Induction: Intravenous  Airway Management Planned: LMA  Additional Equipment:   Intra-op Plan:   Post-operative Plan: Extubation in OR  Informed Consent: I have reviewed the patients History and Physical, chart, labs and discussed the procedure including the risks, benefits and alternatives for the proposed anesthesia with the patient or authorized representative who has indicated his/her understanding and acceptance.   Dental advisory given  Plan Discussed with: CRNA, Anesthesiologist and Surgeon  Anesthesia Plan Comments:        Anesthesia Quick Evaluation

## 2012-11-20 NOTE — Op Note (Signed)
PREOPERATIVE DIAGNOSIS: RIGHT wrist intra-articular distal radius  fracture, 3 or more fragments.   POSTOPERATIVE DIAGNOSIS: RIGHT wrist intra-articular distal radius  fracture, 3 or more fragments.   ATTENDING PHYSICIAN: Sharma Covert IV, MD who scrubbed and present  entire procedure.   ASSISTANT SURGEON: None .  ANESTHESIA: Supraclavicular block performed by SINGER and general  anesthesia.   SURGICAL IMPLANTS: [employer] DVR plate, STANDARD with SEVEN distal locking pegs and 3 bicortical screws proximally  SURGICAL PROCEDURE:  1. Open treatment of RIGHT wrist intra-articular distal radius  fracture, 3 or more fragments.  2. RIGHT wrist brachioradialis tenotomy and release.  3. Radiographs, stress radiographs, RIGHT wrist.   SURGICAL INDICATIONS: Cassandra Cassandra [name]  is a right-hand-dominant female who sustained an intra-articular distal radius fracture after a fall. The  patient was seen and evaluated in the ED based on degree of  displacement and the  displacement, recommended that she undergo  the above procedure. Risks, benefits, and alternatives were discussed  in detail with the patient. Signed informed consent was obtained.  Risks include, but not limited to bleeding, infection, damage to nearby  nerves, arteries, or tendons, nonunion, malunion, hardware failure, loss  of motion of the elbow, wrist, and digits, and need for further surgical  intervention. Attempted closed manipulation was attempted and unsuccessful intraop. Decision was made to proceed with ORIF.  PROCEDURE: The patient was properly identified in the preop holding  area. A mark with a permanent marker was made on the right wrist to  indicate correct operative site. The patient tolerated the block  performed by Anesthesia. The patient was then brought back to the  operating room. The patient received preoperative antibiotics. General  anesthesia was induced. Left upper extremity was prepped and draped in   normal sterile fashion. Time-out was called. Correct site was  identified, and procedure then begun. Attention was then turned to the  left wrist. The limb was then elevated using Esmarch exsanguination and  tourniquet insufflated. A longitudinal incision was made directly over  the FCR sheath. Dissection was then carried down through the skin and  subcutaneous tissue. The FCR sheath was then opened proximally and  distally. Careful dissection was Cassandra [name] going through the floor of the  FCR sheath where the FPL was identified. An L-shaped pronator quadratus  flap was then elevated. In order to aid in reduction tenotomy of the brachioradialis was completed with protection of the first dorsal compartment tendons.  The fracture site was then opened and the patient did have several fracture fragment extending into the joint greater than 3 part intra-articular fracture. Careful open reduction was then carried out. The appropriate size dvr plate was chosen.. The oblong screw hole was then drilled with a 2.5 mm drill bit, then 3.5 mm bicortical screw. Plate height was adjusted.   After position was then confirmed using mini C-arm, the distal row fixation was then carried out with the beginning from an ulnar to radial direction with the  locking pegs. The total of 7 locking pegs were then placed. Following this, attention was then turned proximally where 2 more nonlocking screws were then placed in the 2.5 mm drill bit, the 3.5 mm non-locking screws.   The wound was then thoroughly irrigated. Final stress radiography was then carried out.  Stress radiographs were then obtained under live fluoro showing no widening of the SL interval. I did not see any carpal dissociation with good fixation, without an evidence of penetration in the articular margin with the  locking pegs.    Postop, the pronator quadratus was then closed with 2-0 Vicryl.  Tourniquet was then deflated. Hemostasis was then obtained. The   subcutaneous tissues closed with 4-0 Vicryl and skin closed with simple  nylon sutures. Adaptic dressing and sterile compressive bandage was  then applied. The patient was then placed in a well-padded volar splint. Extubated and taken to recovery room in good condition.    INTRAOPERATIVE RADIOGRAPHS:, 3 views of the wrist do show  the volar plate fixation in place. There is good position in both  planes.    POSTOPERATIVE PLAN: The patient will be admitted for IV antibiotics and  pain control; discharged in the morning. Seen back in the office for  approximately 10-14 days for wound check, suture removal, and then x-  rays, short-arm cast for total 4 weeks, and then begin a therapy regimen  around a 4-week mark. Radiographs at each visit.    Cassandra Done, MD

## 2012-11-20 NOTE — Brief Op Note (Signed)
11/20/2012  10:10 AM  PATIENT:  Verdell Face Schroyer  27 y.o. female  PRE-OPERATIVE DIAGNOSIS:  right wrist fracture  POST-OPERATIVE DIAGNOSIS: SAME  PROCEDURE:  OPEN REDUCTION AND INTERNAL FIXATION RIGHT WRIST  SURGEON:  Surgeon(s) and Role:    * Sharma Covert, MD - Primary  PHYSICIAN ASSISTANT: NONE  ASSISTANTS: none   ANESTHESIA:   general  EBL:   MINIMAL  BLOOD ADMINISTERED:none  DRAINS: none   LOCAL MEDICATIONS USED:  MARCAINE     SPECIMEN:  No Specimen  DISPOSITION OF SPECIMEN:  N/A  COUNTS:  YES  TOURNIQUET:  * No tourniquets in log *  DICTATION: .TYPED  PLAN OF CARE: Discharge to home after PACU  PATIENT DISPOSITION:  PACU - hemodynamically stable.   Delay start of Pharmacological VTE agent (>24hrs) due to surgical blood loss or risk of bleeding: not applicable

## 2012-11-20 NOTE — Transfer of Care (Signed)
Immediate Anesthesia Transfer of Care Note  Patient: Cassandra [name]  Procedure(s) Performed: Procedure(s): attempted CLOSED REDUCTION WRIST; converted to Open reduction internal fixation (Right) OPEN REDUCTION INTERNAL FIXATION (ORIF) WRIST FRACTURE (Right)  Patient Location: PACU  Anesthesia Type:General  Level of Consciousness: awake, alert  and patient cooperative  Airway & Oxygen Therapy: Patient Spontanous Breathing  Post-op Assessment: Report given to PACU RN, Post -op Vital signs reviewed and stable and Patient moving all extremities X 4  Post vital signs: Reviewed and stable  Complications: No apparent anesthesia complications

## 2012-11-21 NOTE — ED Provider Notes (Signed)
Medical screening examination/treatment/procedure(s) were performed by resident physician or non-physician practitioner and as supervising physician I was immediately available for consultation/collaboration.   KINDL,JAMES DOUGLAS MD.   James D Kindl, MD 11/21/12 1438 

## 2012-11-21 NOTE — Progress Notes (Signed)
Patient provided with discharge instructions and follow up information. Educated on signs of infections and will follow up with Dr. Melvyn Novas in 10-14 days. Patient going home with family.

## 2012-11-21 NOTE — Discharge Summary (Signed)
Physician Discharge Summary  Patient ID: Cassandra [name] MRN: [id number] DOB/AGE: 11/18/1985 26 y.o.  Admit date: 11/20/2012 Discharge date: 11/21/2012  Admission Diagnoses: right wrist fracture Past Medical History  Diagnosis Date  . Asthma     Discharge Diagnoses:  Right distal radius fracture  Surgeries: Procedure(s): attempted CLOSED REDUCTION WRIST; converted to Open reduction internal fixation OPEN REDUCTION INTERNAL FIXATION (ORIF) WRIST FRACTURE on 11/20/2012    Consultants:    Discharged Condition: Improved  Hospital Course: Cassandra [name] is an 27 y.o. female who was admitted 11/20/2012 with a chief complaint of  Chief Complaint  Patient presents with  . Labs Only  , and found to have a diagnosis of right wrist fracture.  They were brought to the operating room on 11/20/2012 and underwent Procedure(s): attempted CLOSED REDUCTION WRIST; converted to Open reduction internal fixation OPEN REDUCTION INTERNAL FIXATION (ORIF) WRIST FRACTURE.    They were given perioperative antibiotics: Anti-infectives   Start     Dose/Rate Route Frequency Ordered Stop   11/21/12 0600  ceFAZolin (ANCEF) IVPB 2 g/50 mL premix  Status:  Discontinued     2 g 100 mL/hr over 30 Minutes Intravenous On call to O.R. 11/20/12 1322 11/20/12 1328   11/20/12 2200  ceFAZolin (ANCEF) IVPB 1 g/50 mL premix     1 g 100 mL/hr over 30 Minutes Intravenous 3 times per day 11/20/12 1322 11/21/12 0551   11/20/12 1345  ceFAZolin (ANCEF) IVPB 1 g/50 mL premix     1 g 100 mL/hr over 30 Minutes Intravenous NOW 11/20/12 1322 11/20/12 1415   11/20/12 1034  ceFAZolin (ANCEF) 2-3 GM-% IVPB SOLR    Comments:  MUELLER, TOM: cabinet override      11/20/12 1034 11/20/12 2244    .  They were given sequential compression devices, early ambulation, and ambulation for DVT prophylaxis.  Recent vital signs: Patient Vitals for the past 24 hrs:  BP Temp Temp src Pulse Resp SpO2  11/21/12 0359 123/77 mmHg 98.9 F (37.2 C)  Oral 81 18 100 %  11/20/12 2150 124/66 mmHg 98.4 F (36.9 C) Oral 92 18 100 %  11/20/12 1330 113/62 mmHg 98 F (36.7 C) - 84 18 100 %  11/20/12 1245 - 97.2 F (36.2 C) - 86 17 98 %  11/20/12 1244 125/73 mmHg - - 90 16 98 %  11/20/12 1230 120/72 mmHg - - 84 18 96 %  11/20/12 1216 121/68 mmHg 97.7 F (36.5 C) - 92 17 95 %  .  Recent laboratory studies: Dg Wrist Complete Right  11/19/2012  *RADIOLOGY REPORT*  Clinical Data: Punched wall with pain  RIGHT WRIST - COMPLETE 3+ VIEW  Comparison: None.  Findings: There is a transverse impacted fracture of the distal right radius which extends intra-articular.  A tiny fracture fragment from the ulnar styloid also is noted.  The carpal bones are in normal position.  IMPRESSION: Impacted transverse fracture of the distal right radius extending intra-articular.  Small fragment from the ulnar styloid as well.   Original Report Authenticated By: Dwyane Dee, M.D.     Discharge Medications:     Medication List    STOP taking these medications       HYDROcodone-acetaminophen 5-325 MG per tablet  Commonly known as:  NORCO/VICODIN      TAKE these medications       docusate sodium 100 MG capsule  Commonly known as:  COLACE  Take 1 capsule (100 mg total) by mouth 2 (two) times  daily.     etonogestrel-ethinyl estradiol 0.12-0.015 MG/24HR vaginal ring  Commonly known as:  NUVARING  Place 1 each vaginally every 28 (twenty-eight) days. Insert vaginally and leave in place for 3 consecutive weeks, then remove for 1 week.     methocarbamol 500 MG tablet  Commonly known as:  ROBAXIN  Take 1 tablet (500 mg total) by mouth 4 (four) times daily.     oxyCODONE-acetaminophen 5-325 MG per tablet  Commonly known as:  ROXICET  Take 1 tablet by mouth every 4 (four) hours as needed for pain.      ASK your doctor about these medications       diphenhydrAMINE 25 mg capsule  Commonly known as:  BENADRYL  Take 25 mg by mouth every 6 (six) hours as needed for  itching. For itching     ibuprofen 200 MG tablet  Commonly known as:  ADVIL,MOTRIN  Take 400 mg by mouth every 6 (six) hours as needed for pain. For pain        Diagnostic Studies: Dg Wrist Complete Right  11/19/2012  *RADIOLOGY REPORT*  Clinical Data: Punched wall with pain  RIGHT WRIST - COMPLETE 3+ VIEW  Comparison: None.  Findings: There is a transverse impacted fracture of the distal right radius which extends intra-articular.  A tiny fracture fragment from the ulnar styloid also is noted.  The carpal bones are in normal position.  IMPRESSION: Impacted transverse fracture of the distal right radius extending intra-articular.  Small fragment from the ulnar styloid as well.   Original Report Authenticated By: Dwyane Dee, M.D.     They benefited maximally from their hospital stay and there were no complications.     Disposition: 01-Home or Self Care     Signed: Sharma Covert 11/21/2012, 11:15 AM

## 2012-11-22 ENCOUNTER — Encounter (HOSPITAL_COMMUNITY): Payer: Self-pay | Admitting: Orthopedic Surgery

## 2012-11-30 ENCOUNTER — Telehealth: Payer: Self-pay | Admitting: Obstetrics and Gynecology

## 2012-11-30 NOTE — Telephone Encounter (Signed)
Spoke with pt rgd msg pt wants rx for nuvaring before she leaves for [employer] advised pt no rx on file for nuvaring pt states we never wrote rx for nuvaring her dr in [place] did advised pt need appt for aex last pap 2010 in order to get rx offered pt an appt for today or tomorrow pt declined appt pt states leave tonight for [employer]

## 2013-03-30 ENCOUNTER — Emergency Department (HOSPITAL_COMMUNITY)
Admission: EM | Admit: 2013-03-30 | Discharge: 2013-03-30 | Disposition: A | Discharge status: Home / Self Care | Payer: Managed Care, Other (non HMO) | Attending: Emergency Medicine | Admitting: Emergency Medicine

## 2013-03-30 ENCOUNTER — Encounter (HOSPITAL_COMMUNITY): Payer: Self-pay | Admitting: Emergency Medicine

## 2013-03-30 DIAGNOSIS — R51 Headache: Secondary | ICD-10-CM | POA: Insufficient documentation

## 2013-03-30 DIAGNOSIS — H938X9 Other specified disorders of ear, unspecified ear: Secondary | ICD-10-CM | POA: Insufficient documentation

## 2013-03-30 DIAGNOSIS — F17200 Nicotine dependence, unspecified, uncomplicated: Secondary | ICD-10-CM | POA: Insufficient documentation

## 2013-03-30 DIAGNOSIS — J329 Chronic sinusitis, unspecified: Secondary | ICD-10-CM | POA: Insufficient documentation

## 2013-03-30 DIAGNOSIS — J029 Acute pharyngitis, unspecified: Secondary | ICD-10-CM | POA: Insufficient documentation

## 2013-03-30 DIAGNOSIS — J45909 Unspecified asthma, uncomplicated: Secondary | ICD-10-CM | POA: Insufficient documentation

## 2013-03-30 MED ORDER — GUAIFENESIN ER 600 MG PO TB12: 1200.0000 mg | ORAL_TABLET | Freq: Two times a day (BID) | ORAL | Status: DC

## 2013-03-30 MED ORDER — CETIRIZINE-PSEUDOEPHEDRINE ER 5-120 MG PO TB12: 1.0000 | ORAL_TABLET | Freq: Two times a day (BID) | ORAL | Status: DC

## 2013-03-30 MED ORDER — AMOXICILLIN 500 MG PO CAPS: 500.0000 mg | ORAL_CAPSULE | Freq: Three times a day (TID) | ORAL | Status: DC

## 2013-03-30 MED ORDER — FLUTICASONE PROPIONATE 50 MCG/ACT NA SUSP: 2.0000 | Freq: Every day | NASAL | Status: DC

## 2013-03-30 NOTE — ED Provider Notes (Signed)
History     CSN: [id number]  Arrival date & time 03/30/13  4098   First MD Initiated Contact with Patient 03/30/13 0510      Chief Complaint  Patient presents with  . Nasal Congestion   HPI  History provided by the patient. The patient is 27 year old female with history of asthma presents with complaints of persistent nasal congestion, ear pressure and sore throat. Symptoms have been present for 2 weeks or longer. She states she has tried several over-the-counter medications without any relief. She has taken Benadryl regularly with some Mucinex intermittently. She had tried Sudafed once. She also has used Afrin for 2 days without any improvements. She has sinus pressure in her face and fore head with a general dull aching headache. She also complains of postnasal drainage with sore throat symptoms. Denies any fever, chills or sweats. Denies any cough. No other aggravating or alleviating factors. No other associated symptoms.     Past Medical History  Diagnosis Date  . Asthma     Past Surgical History  Procedure Laterality Date  . Closed reduction wrist fracture Right 11/20/2012    Procedure: attempted CLOSED REDUCTION WRIST; converted to Open reduction internal fixation;  Surgeon: Sharma Covert, MD;  Location: [facility] OR;  Service: Orthopedics;  Laterality: Right;  . Orif wrist fracture Right 11/20/2012    Procedure: OPEN REDUCTION INTERNAL FIXATION (ORIF) WRIST FRACTURE;  Surgeon: Sharma Covert, MD;  Location: [facility] OR;  Service: Orthopedics;  Laterality: Right;    No family history on file.  History  Substance Use Topics  . Smoking status: Current Every Day Smoker  . Smokeless tobacco: Not on file  . Alcohol Use: Yes    OB History   Grav Para Term Preterm Abortions TAB SAB Ect Mult Living                  Review of Systems  Constitutional: Negative for fever, chills and diaphoresis.  HENT: Positive for ear pain, congestion, sore throat and sinus pressure. Negative for  tinnitus.   Neurological: Positive for headaches.  All other systems reviewed and are negative.    Allergies  Review of patient's allergies indicates no known allergies.  Home Medications   Current Outpatient Rx  Name  Route  Sig  Dispense  Refill  . docusate sodium (COLACE) 100 MG capsule   Oral   Take 1 capsule (100 mg total) by mouth 2 (two) times daily.   30 capsule   0   . etonogestrel-ethinyl estradiol (NUVARING) 0.12-0.015 MG/24HR vaginal ring   Vaginal   Place 1 each vaginally every 28 (twenty-eight) days. Insert vaginally and leave in place for 3 consecutive weeks, then remove for 1 week.         Marland Kitchen ibuprofen (ADVIL,MOTRIN) 200 MG tablet   Oral   Take 400 mg by mouth every 6 (six) hours as needed for pain. For pain         . methocarbamol (ROBAXIN) 500 MG tablet   Oral   Take 1 tablet (500 mg total) by mouth 4 (four) times daily.   30 tablet   0   . oxyCODONE-acetaminophen (ROXICET) 5-325 MG per tablet   Oral   Take 1 tablet by mouth every 4 (four) hours as needed for pain.   45 tablet   0     BP 106/78  Pulse 64  Temp(Src) 98.4 F (36.9 C) (Oral)  Resp 14  SpO2 98%  Physical Exam  Nursing note  and vitals reviewed. Constitutional: She is oriented to person, place, and time. She appears well-developed and well-nourished. No distress.  HENT:  Head: Normocephalic.  Nose: Mucosal edema present. No rhinorrhea. Right sinus exhibits maxillary sinus tenderness. Right sinus exhibits no frontal sinus tenderness. Left sinus exhibits maxillary sinus tenderness. Left sinus exhibits no frontal sinus tenderness.  Mouth/Throat: Oropharynx is clear and moist.  Slice signs of effusion to bilateral TMs. There is no significant erythema. No significant bulging.   Cardiovascular: Normal rate and regular rhythm.   Pulmonary/Chest: Effort normal and breath sounds normal. No respiratory distress. She has no wheezes. She has no rales.  Abdominal: Soft.  Musculoskeletal:  Normal range of motion.  Neurological: She is alert and oriented to person, place, and time.  Skin: Skin is warm and dry. No rash noted.  Psychiatric: She has a normal mood and affect. Her behavior is normal.    ED Course  Procedures       1. Sinusitis       MDM  5:15AM patient seen and evaluated. The patient well-appearing in no acute distress. Appears fairly or toxic. She is afebrile.        Angus Seller, PA-C 03/30/13 [phone]

## 2013-03-30 NOTE — ED Provider Notes (Signed)
Medical screening examination/treatment/procedure(s) were performed by non-physician practitioner and as supervising physician I was immediately available for consultation/collaboration.   Julie Manly, MD 03/30/13 0623 

## 2013-03-30 NOTE — ED Notes (Signed)
PT. REPORTS NASAL CONGESTION AND RIGHT EAR DISCOMFORT " IT'S POPPING" FOR SEVERAL DAYS UNRELIEVED BY OTC MEDICATIONS .

## 2013-06-06 ENCOUNTER — Ambulatory Visit (INDEPENDENT_AMBULATORY_CARE_PROVIDER_SITE_OTHER): Payer: Managed Care, Other (non HMO) | Admitting: General Surgery

## 2013-06-06 ENCOUNTER — Encounter (INDEPENDENT_AMBULATORY_CARE_PROVIDER_SITE_OTHER): Payer: Self-pay | Admitting: General Surgery

## 2013-06-06 VITALS — BP 110/64 | HR 76 | Resp 14 | Ht 64.0 in | Wt 161.2 lb

## 2013-06-06 DIAGNOSIS — J45909 Unspecified asthma, uncomplicated: Secondary | ICD-10-CM

## 2013-06-06 DIAGNOSIS — D171 Benign lipomatous neoplasm of skin and subcutaneous tissue of trunk: Secondary | ICD-10-CM

## 2013-06-06 DIAGNOSIS — D1739 Benign lipomatous neoplasm of skin and subcutaneous tissue of other sites: Secondary | ICD-10-CM

## 2013-06-06 NOTE — Progress Notes (Signed)
Patient ID: Cassandra [name], female   DOB: Jul 01, 1986, 27 y.o.   MRN: [id number]  Chief Complaint  Patient presents with  . New Evaluation    eval large lipoma on abd wall    HPI Cassandra [name] is a 27 y.o. female.  Referred by Tarry Kos PAC HPI 11 yof with several week history of enlarging left flank mass.  No history of infection, no drainage, no real pain just large mass that is increasing in size that has become bothersome.  She would like to discuss excision.  Past Medical History  Diagnosis Date  . Asthma     Past Surgical History  Procedure Laterality Date  . Closed reduction wrist fracture Right 11/20/2012    Procedure: attempted CLOSED REDUCTION WRIST; converted to Open reduction internal fixation;  Surgeon: Sharma Covert, MD;  Location: [facility] OR;  Service: Orthopedics;  Laterality: Right;  . Orif wrist fracture Right 11/20/2012    Procedure: OPEN REDUCTION INTERNAL FIXATION (ORIF) WRIST FRACTURE;  Surgeon: Sharma Covert, MD;  Location: [facility] OR;  Service: Orthopedics;  Laterality: Right;    History reviewed. No pertinent family history.  Social History History  Substance Use Topics  . Smoking status: Current Every Day Smoker -- 0.25 packs/day    Types: Cigarettes  . Smokeless tobacco: Never Used  . Alcohol Use: Yes    No Known Allergies  Current Outpatient Prescriptions  Medication Sig Dispense Refill  . etonogestrel-ethinyl estradiol (NUVARING) 0.12-0.015 MG/24HR vaginal ring Place 1 each vaginally every 28 (twenty-eight) days. Insert vaginally and leave in place for 3 consecutive weeks, then remove for 1 week.      Marland Kitchen ibuprofen (ADVIL,MOTRIN) 200 MG tablet Take 400 mg by mouth every 6 (six) hours as needed for pain. For pain       No current facility-administered medications for this visit.    Review of Systems Review of Systems  Constitutional: Negative for fever, chills and unexpected weight change.  HENT: Negative for hearing loss, congestion, sore throat,  trouble swallowing and voice change.   Eyes: Negative for visual disturbance.  Respiratory: Negative for cough and wheezing.   Cardiovascular: Negative for chest pain, palpitations and leg swelling.  Gastrointestinal: Negative for nausea, vomiting, abdominal pain, diarrhea, constipation, blood in stool, abdominal distention and anal bleeding.  Genitourinary: Negative for hematuria, vaginal bleeding and difficulty urinating.  Musculoskeletal: Negative for arthralgias.  Skin: Negative for rash and wound.  Neurological: Negative for seizures, syncope and headaches.  Hematological: Negative for adenopathy. Does not bruise/bleed easily.  Psychiatric/Behavioral: Negative for confusion.    Blood pressure 110/64, pulse 76, resp. rate 14, height 5\' 4"  (1.626 m), weight 161 lb 3.2 oz (73.12 kg).  Physical Exam Physical Exam  Vitals reviewed. Constitutional: She appears well-developed and well-nourished.  Cardiovascular: Normal rate, regular rhythm and normal heart sounds.   Pulmonary/Chest: Effort normal and breath sounds normal. She has no wheezes. She has no rales.  Abdominal: Bowel sounds are normal. There is no tenderness.    Lymphadenopathy:    She has no cervical adenopathy.      Assessment    Left flank lipoma    Plan    Left flank lipoma  We discussed observation vs excision.  She would like excised due to size and growth.  We discussed excision under anesthesia due to size.  Risks include drain placement, seroma, bleeding, infection.  She will call back when she wants to schedule       [facility] 06/06/2013, 10:56 PM

## 2013-06-07 ENCOUNTER — Telehealth (INDEPENDENT_AMBULATORY_CARE_PROVIDER_SITE_OTHER): Payer: Self-pay | Admitting: General Surgery

## 2013-06-07 DIAGNOSIS — J45909 Unspecified asthma, uncomplicated: Secondary | ICD-10-CM | POA: Insufficient documentation

## 2013-06-07 NOTE — Telephone Encounter (Signed)
Spoke w pt she has school and work. Will call when ready to schedule

## 2014-09-06 ENCOUNTER — Emergency Department (HOSPITAL_COMMUNITY): Payer: BC Managed Care – PPO

## 2014-09-06 ENCOUNTER — Emergency Department (HOSPITAL_COMMUNITY)
Admission: EM | Admit: 2014-09-06 | Discharge: 2014-09-06 | Disposition: A | Discharge status: Home / Self Care | Payer: BC Managed Care – PPO | Attending: Emergency Medicine | Admitting: Emergency Medicine

## 2014-09-06 ENCOUNTER — Encounter (HOSPITAL_COMMUNITY): Payer: Self-pay | Admitting: Emergency Medicine

## 2014-09-06 DIAGNOSIS — W010XXA Fall on same level from slipping, tripping and stumbling without subsequent striking against object, initial encounter: Secondary | ICD-10-CM | POA: Diagnosis not present

## 2014-09-06 DIAGNOSIS — Y9289 Other specified places as the place of occurrence of the external cause: Secondary | ICD-10-CM | POA: Insufficient documentation

## 2014-09-06 DIAGNOSIS — Y998 Other external cause status: Secondary | ICD-10-CM | POA: Diagnosis not present

## 2014-09-06 DIAGNOSIS — W19XXXA Unspecified fall, initial encounter: Secondary | ICD-10-CM

## 2014-09-06 DIAGNOSIS — Y9389 Activity, other specified: Secondary | ICD-10-CM | POA: Insufficient documentation

## 2014-09-06 DIAGNOSIS — S99922A Unspecified injury of left foot, initial encounter: Secondary | ICD-10-CM | POA: Diagnosis present

## 2014-09-06 DIAGNOSIS — J45909 Unspecified asthma, uncomplicated: Secondary | ICD-10-CM | POA: Insufficient documentation

## 2014-09-06 DIAGNOSIS — S93602A Unspecified sprain of left foot, initial encounter: Secondary | ICD-10-CM | POA: Insufficient documentation

## 2014-09-06 DIAGNOSIS — Z720 Tobacco use: Secondary | ICD-10-CM | POA: Insufficient documentation

## 2014-09-06 MED ORDER — ACETAMINOPHEN 325 MG PO TABS
325.0000 mg | ORAL_TABLET | Freq: Once | ORAL | Status: DC
  Filled 2014-09-06: qty 1

## 2014-09-06 MED ORDER — IBUPROFEN 200 MG PO TABS
400.0000 mg | ORAL_TABLET | Freq: Once | ORAL | Status: AC
  Administered 2014-09-06: 400 mg via ORAL
  Filled 2014-09-06: qty 2

## 2014-09-06 MED ORDER — ACETAMINOPHEN 325 MG PO TABS: 650.0000 mg | ORAL_TABLET | Freq: Once | ORAL | Status: DC

## 2014-09-06 MED ORDER — IBUPROFEN 800 MG PO TABS: 800.0000 mg | ORAL_TABLET | Freq: Once | ORAL | Status: DC

## 2014-09-06 NOTE — Discharge Instructions (Signed)
Please call your doctor for a followup appointment within 24-48 hours. When you talk to your doctor please let them know that you were seen in the emergency department and have them acquire all of your records so that they can discuss the findings with you and formulate a treatment plan to fully care for your new and ongoing problems. Please call and set-up an appointment with Health and [place] to set up a primary care provider Please call and set-up an appointment with Orthopedics to be seen and assessed Please rest, ice, and elevate - toes above nose Keep foot wrapped Please use crutches for comfort purposes Please continue to monitor symptoms closely and if symptoms are to worsen or change (fever greater than 101, chills, sweating, nausea, vomiting, chest pain, shortness of breathe, difficulty breathing, weakness, numbness, tingling, worsening or changes to pain pattern, swelling, changes to skin color, fall, injury, loss of sensation, redness, inability to wiggle toes) please report back to the Emergency Department immediately.   Foot Sprain The muscles and cord like structures which attach muscle to bone (tendons) that surround the feet are made up of units. A foot sprain can occur at the weakest spot in any of these units. This condition is most often caused by injury to or overuse of the foot, as from playing contact sports, or aggravating a previous injury, or from poor conditioning, or obesity. SYMPTOMS  Pain with movement of the foot.  Tenderness and swelling at the injury site.  Loss of strength is present in moderate or severe sprains. THE THREE GRADES OR SEVERITY OF FOOT SPRAIN ARE:  Mild (Grade I): Slightly pulled muscle without tearing of muscle or tendon fibers or loss of strength.  Moderate (Grade II): Tearing of fibers in a muscle, tendon, or at the attachment to bone, with small decrease in strength.  Severe (Grade III): Rupture of the muscle-tendon-bone attachment,  with separation of fibers. Severe sprain requires surgical repair. Often repeating (chronic) sprains are caused by overuse. Sudden (acute) sprains are caused by direct injury or over-use. DIAGNOSIS  Diagnosis of this condition is usually by your own observation. If problems continue, a caregiver may be required for further evaluation and treatment. X-rays may be required to make sure there are not breaks in the bones (fractures) present. Continued problems may require physical therapy for treatment. PREVENTION  Use strength and conditioning exercises appropriate for your sport.  Warm up properly prior to working out.  Use athletic shoes that are made for the sport you are participating in.  Allow adequate time for healing. Early return to activities makes repeat injury more likely, and can lead to an unstable arthritic foot that can result in prolonged disability. Mild sprains generally heal in 3 to 10 days, with moderate and severe sprains taking 2 to 10 weeks. Your caregiver can help you determine the proper time required for healing. HOME CARE INSTRUCTIONS   Apply ice to the injury for 15-20 minutes, 03-04 times per day. Put the ice in a plastic bag and place a towel between the bag of ice and your skin.  An elastic wrap (like an Ace bandage) may be used to keep swelling down.  Keep foot above the level of the heart, or at least raised on a footstool, when swelling and pain are present.  Try to avoid use other than gentle range of motion while the foot is painful. Do not resume use until instructed by your caregiver. Then begin use gradually, not increasing use to  the point of pain. If pain does develop, decrease use and continue the above measures, gradually increasing activities that do not cause discomfort, until you gradually achieve normal use.  Use crutches if and as instructed, and for the length of time instructed.  Keep injured foot and ankle wrapped between treatments.  Massage  foot and ankle for comfort and to keep swelling down. Massage from the toes up towards the knee.  Only take over-the-counter or prescription medicines for pain, discomfort, or fever as directed by your caregiver. SEEK IMMEDIATE MEDICAL CARE IF:   Your pain and swelling increase, or pain is not controlled with medications.  You have loss of feeling in your foot or your foot turns cold or blue.  You develop new, unexplained symptoms, or an increase of the symptoms that brought you to your caregiver. MAKE SURE YOU:   Understand these instructions.  Will watch your condition.  Will get help right away if you are not doing well or get worse. Document Released: 03/20/2002 Document Revised: 12/21/2011 Document Reviewed: 05/17/2008 [facility] Patient Information 2015 [place], [place]. This information is not intended to replace advice given to you by your health care provider. Make sure you discuss any questions you have with your health care provider.   Emergency Department Resource Guide 1) Find a Doctor and Pay Out of Pocket Although you won't have to find out who is covered by your insurance plan, it is a good idea to ask around and get recommendations. You will then need to call the office and see if the doctor you have chosen will accept you as a new patient and what types of options they offer for patients who are self-pay. Some doctors offer discounts or will set up payment plans for their patients who do not have insurance, but you will need to ask so you aren't surprised when you get to your appointment.  2) Contact Your Local Health Department Not all health departments have doctors that can see patients for sick visits, but many do, so it is worth a call to see if yours does. If you don't know where your local health department is, you can check in your phone book. The CDC also has a tool to help you locate your state's health department, and many state websites also have listings of all  of their local health departments.  3) Find a [facility] If your illness is not likely to be very severe or complicated, you may want to try a walk in clinic. These are popping up all over the country in pharmacies, drugstores, and shopping centers. They're usually staffed by nurse practitioners or physician assistants that have been trained to treat common illnesses and complaints. They're usually fairly quick and inexpensive. However, if you have serious medical issues or chronic medical problems, these are probably not your best option.  No Primary Care Doctor: - Call Health Connect at  [phone] - they can help you locate a primary care doctor that  accepts your insurance, provides certain services, etc. - Physician Referral Service- [phone]  Chronic Pain Problems: Organization         Address  Phone   Notes  [facility]  [phone] Patients need to be referred by their primary care doctor.   Medication Assistance: Organization         Address  Phone   Notes  [facility] Medication [facility] [address] [place], [place] [address] [phone] --Must be  a resident of [facility] -- Must have NO insurance coverage whatsoever (no Medicaid/ Medicare, etc.) -- The pt. MUST have a primary care doctor that directs their care regularly and follows them in the community   MedAssist  [phone]   [employer]  [phone]    Agencies that provide inexpensive medical care: Organization         Address  Phone   Notes  [place]  [phone]   [facility]    [phone]   [facility] [place], [place] [address] [phone]   [place] [address], [place] [phone]   Planned Parenthood    [phone]   [facility]    [phone]   [place] and [address],  [place] Phone:  [phone], Fax:  [phone] Hours of Operation:  9 am - 6 pm, M-F.  Also accepts Medicaid/Medicare and self-pay.  [facility] for [place], [address], [place] Phone: [phone], Fax: [phone]. Hours of Operation:  8:30 am - 5:30 pm, M-F.  Also accepts Medicaid and self-pay.  [facility] [place] [address] [place] Phone: [phone]   [place], [place], [place] [phone], Ext. [phone] Mondays & Thursdays: 7-9 AM.  First 15 patients are seen on a first come, first serve basis.    [place] Providers:  Organization         Address  Phone   Notes  [facility] [address], [place] [phone] Also accepts self-pay patients.  [facility] [address], [place]  [phone]   [place], [address], [place] [phone]   [facility] [address], [place] [phone]   Lucianne Lei [address], [place]   [phone] Only accepts [place] Access [place] patients after they have their name applied to their card.   Self-Pay (no insurance) in [facility]:  Organization         Address  Phone   Notes  Sickle Cell Patients, [facility] [place] [phone]   [facility] [place] [phone]   [facility] [place]  [place], [place], [place] [phone]   [facility]/Dr. Osei-Bonsu  [address] [place] or [address], [place] [phone] Phone number for both [place] and [place] locations is the same.  [facility] [address], [place] [phone]   [facility] [address], [place] or [address] [phone]   [facility] [address] [place] [phone], phone; [phone], fax Sees patients 1st and 3rd Saturday of every month.  Must not qualify for public or private insurance (i.e. Medicaid, Medicare, [place] Health Choice, Veterans' Benefits)  Household income should be no more than 200% of the poverty level The clinic cannot treat you if you are pregnant or think you are pregnant  Sexually transmitted diseases are not treated at the clinic.    Dental Care: Organization  Address  Phone  Notes  [facility] [place], [place] [phone] Accepts children up to age 7 who are enrolled in [place] or [place]; pregnant women with a Medicaid card; and children who have applied for Medicaid or [place] Health Choice, but were declined, whose parents can pay a reduced fee at time of service.  [facility] of [facility]  [address], [place] [phone] Accepts children up to age 76 who are enrolled in [place] or [place]; pregnant women with a Medicaid card; and children who have applied for Medicaid or [place] Health Choice, but were declined, whose parents can pay a reduced fee at time of service.  [place] Adult Dental Access PROGRAM  [place] [phone] Patients are seen by appointment only. Walk-ins are not accepted. [place] will see patients 7 years of age and older. Monday - Tuesday (8am-5pm) Most Wednesdays (8:30-5pm) $30 per visit, cash only  [facility] (1-Rh) Adult Dental Access PROGRAM  [address], [facility] [phone] Patients are seen by appointment only. Walk-ins are not accepted. [place] will see patients 47 years of age and older. One Wednesday Evening (Monthly: Volunteer Based).  $30 per visit, cash only  [place]   [phone] for adults; Children under age 67, call Graduate Pediatric Dentistry at [phone]. Children aged 75-14, please call [phone] to request a pediatric application.  Dental services are provided in all areas of dental care including fillings, crowns and bridges, complete and partial dentures, implants, gum treatment, root canals, and extractions. Preventive care is also provided. Treatment is provided to both adults and children. Patients are selected via a lottery and there is often a waiting list.   [facility] [address] [place]  [phone] [email or web address]   [facility] [address] [place], [place] [phone], Ext. [phone] Second and Fourth Thursday of each month, opens at 6:30 AM; Clinic ends at 9 AM.  Patients are seen on a first-come first-served basis, and a limited number are seen during each clinic.   [facility]  [address] Hillard Danker [place], [place] [phone]   Eligibility Requirements You must have lived in [place], [place], or [place] counties for at least the last three months.   You cannot be eligible for state or federal sponsored [employer], including [employer], [place], or [employer].   You generally cannot be eligible for healthcare insurance through your employer.    How to apply: Eligibility screenings are held every Tuesday and Wednesday afternoon from 1:00 pm until 4:00 pm. You do not need an appointment for the interview!  [facility] [address], [place], [place]   [place]  [place] Department  [place]  [phone]    Behavioral Health Resources in the Community: Intensive Outpatient Programs Organization         Address  Phone  Notes  [place]. [address] [place], [place] [phone]   [facility] Outpatient [address], [place], [place]   ADS: Alcohol & Drug Svcs [address], [place], [place]   [place] [address],  [place], [place] or [phone]   Substance Abuse Resources  Organization         Address  Phone  Notes  Alcohol and Drug Services  [place]  [phone]   [facility] [place]  [phone]   Chinita Pester  [phone]   Residential & Outpatient Substance Abuse Program  [phone]   Psychological Services Organization         Address  Phone  Notes  [facility] [place]  [place]  [phone]   [place] [address], [place] or [phone]    Mobile Crisis Teams Organization         Address  Phone  Notes  [facility], Mobile Crisis Care Unit  [phone]   [facility]  [address] [place], [place]   [place] [address], [place] [phone]    Self-Help/Support Groups Organization         Address  Phone             Notes  [place]. of [place] - variety of support groups  [place] Call for more information  Narcotics Anonymous (NA), [facility] [address], [employer] [place]  2 meetings at this location   Special educational needs teacher         Address  Phone  Notes  ASAP Residential Treatment [place],    [place]  [phone]-(731)[phone]   [facility]  [address] [place] [address] [place] 756433, [place], [place]   [place], [place] [phone] Admissions: 8am-3pm M-F  Incentives Substance [place] [address]    [place], [place] [phone]   [facility] [address] [place], [place], Regino Ramirez   [facility] [address]  [place], [place]     Insight Programs - Intensive Outpatient [address] [place]een [place] 2, [place], [place]   [facility] ([place].) [place].,  [place], [place] [phone] or [phone]   [facility]idential Treatment Services (RTS) [address], [place], [place] Accepts Medicaid  [facility] [place] [address]  [place] [phone] Substance Abuse/Addiction Treatment   [facility] Organization         Address  Phone  Notes  [facility]  [phone]   Domenic Schwab, PhD [address] Arlis [place], [place]   [phone] or [phone]   [place], [place] [phone]   [facility] [address] [place], [place] [phone] Insurance/Medicaid/sponsorship through [facility] and Families [address] [place]                                    [place], [place] [phone] [place] [address], [place] [phone]    Dr. Adele Schilder  [phone]   [facility] Dept. 1) [address] [place] 2) [place] 3)  [place] [address], [place] [phone]  [phone]   [place] [phone] or [phone] (After Hours)

## 2014-09-06 NOTE — ED Notes (Signed)
Called Marissa PA about pt requesting pain meds.

## 2014-09-06 NOTE — ED Notes (Signed)
Pt c/o l/foot pain since last night. Pt stated that she was dancing on a rug and her foot slipped and she fell.

## 2014-09-06 NOTE — ED Notes (Signed)
Pt requested Motrin

## 2014-09-06 NOTE — ED Provider Notes (Signed)
CSN: [id number]     Arrival date & time 09/06/14  1220 History   First MD Initiated Contact with Patient 09/06/14 1227     Chief Complaint  Patient presents with  . Foot Injury    pt sliiped and fell, twisted foot     (Consider location/radiation/quality/duration/timing/severity/associated sxs/prior Treatment) The history is provided by the patient. No language interpreter was used.  Cassandra [name] is a 28 y/o F with PMHx of asthma presenting to the ED with left foot pain that started this morning. Patient reported that last night she was dancing with her nieces when she twisted her left foot and fell backwards - reported that this occurred at approximately 9:00 PM last night - stated that she did not hit her head. Reported that she has been having a constant pain to the left foot with radiation to the toes. Stated that she elevated her foot last night with a pillow. Reported that when she woke up this morning she reported that the pain increased and that she was not able to apply pressure secondary to the pain. Denied history of injury or surgery to left foot or ankle, loss of sensation, numbness, ankle pain, head injury, loss consciousness. PCP none LMP 08/19/2014, patient on Nuvaring.   Past Medical History  Diagnosis Date  . Asthma    Past Surgical History  Procedure Laterality Date  . Closed reduction wrist fracture Right 11/20/2012    Procedure: attempted CLOSED REDUCTION WRIST; converted to Open reduction internal fixation;  Surgeon: Linna Hoff, MD;  Location: [place];  Service: Orthopedics;  Laterality: Right;  . Orif wrist fracture Right 11/20/2012    Procedure: OPEN REDUCTION INTERNAL FIXATION (ORIF) WRIST FRACTURE;  Surgeon: Linna Hoff, MD;  Location: [place];  Service: Orthopedics;  Laterality: Right;   Family History  Problem Relation Age of Onset  . Adopted: Yes   History  Substance Use Topics  . Smoking status: Current Every Day Smoker -- 0.25 packs/day    Types:  Cigarettes  . Smokeless tobacco: Never Used  . Alcohol Use: Yes   OB History    No data available     Review of Systems  Musculoskeletal: Positive for arthralgias (left foot pain ).  Neurological: Negative for dizziness, weakness, numbness and headaches.      Allergies  Review of patient's allergies indicates no known allergies.  Home Medications   Prior to Admission medications   Medication Sig Start Date End Date Taking? Authorizing Provider  etonogestrel-ethinyl estradiol (NUVARING) 0.12-0.015 MG/24HR vaginal ring Place 1 each vaginally every 28 (twenty-eight) days. Insert vaginally and leave in place for 3 consecutive weeks, then remove for 1 week.    Historical Provider, MD  ibuprofen (ADVIL,MOTRIN) 200 MG tablet Take 400 mg by mouth every 6 (six) hours as needed for pain. For pain    Historical Provider, MD   BP 131/74 mmHg  Pulse 97  Temp(Src) 99.1 F (37.3 C) (Oral)  Resp 18  SpO2 100%  LMP 08/19/2014 Physical Exam  Constitutional: She is oriented to person, place, and time. She appears well-developed and well-nourished. No distress.  HENT:  Head: Normocephalic and atraumatic.  Eyes: Conjunctivae and EOM are normal. Right eye exhibits no discharge. Left eye exhibits no discharge.  Neck: Normal range of motion. Neck supple. No tracheal deviation present.  Cardiovascular: Normal rate, regular rhythm and normal heart sounds.  Exam reveals no friction rub.   No murmur heard. Pulses:      Radial pulses  are 2+ on the right side, and 2+ on the left side.       Dorsalis pedis pulses are 2+ on the right side, and 2+ on the left side.  Cap refill < 3 seconds  Pulmonary/Chest: Effort normal and breath sounds normal. No respiratory distress. She has no wheezes. She has no rales.  Musculoskeletal: Normal range of motion. She exhibits tenderness. She exhibits no edema.       Left foot: There is tenderness. There is normal range of motion, no bony tenderness, no swelling,  normal capillary refill, no crepitus and no deformity.       Feet:  Negative swelling, erythema, inflammation, lesions, sores, ecchymosis, deformities, malalignment identified to the left foot and ankle. Discomfort upon palpation to the dorsal aspect of the left foot, at base of the toes. Full dorsiflexion, plantar flexion, inversion, eversion of the left foot without difficulty or ataxia. Patient is able to wiggle toes without difficulty.  Lymphadenopathy:    She has no cervical adenopathy.  Neurological: She is alert and oriented to person, place, and time. No cranial nerve deficit. She exhibits normal muscle tone. Coordination normal.  Cranial nerves III-XII grossly intact Strength 5+/5+ to lower extremities bilaterally with resistance applied, equal distribution noted Strength intact to digits of the feet bilaterally Sensation intact with differentiation to sharp and dull touch 2 point discrimination intact to medial and lateral aspects of the digits of the left foot  Skin: Skin is warm and dry. No rash noted. She is not diaphoretic. No erythema.  Psychiatric: She has a normal mood and affect. Her behavior is normal. Thought content normal.  Nursing note and vitals reviewed.   ED Course  Procedures (including critical care time) Labs Review Labs Reviewed - No data to display  Imaging Review Dg Ankle Complete Left  09/06/2014   ADDENDUM REPORT: 09/06/2014 13:21  ADDENDUM: Dictation error:  Impression replacement:  No fracture or dislocation.   Electronically Signed   By: Suzy Bouchard M.D.   On: 09/06/2014 13:21   09/06/2014   CLINICAL DATA:  Pt was dancing with her cousins and injured Lt foot/ankle. No prior injury to area, pt c/o pain on dorsal surface of Lt foot towards toes and medial side of foot.  EXAM: LEFT ANKLE COMPLETE - 3+ VIEW  COMPARISON:  None.  FINDINGS: Ankle mortise intact. The talar dome is normal. No malleolar fracture. The calcaneus is normal.  IMPRESSION: No  acute cardiopulmonary process.  Electronically Signed: By: Suzy Bouchard M.D. On: 09/06/2014 13:08   Dg Foot Complete Left  09/06/2014   CLINICAL DATA:  Injury to left foot and ankle from dancing. Dorsal foot pain. Initial encounter.  EXAM: LEFT FOOT - COMPLETE 3+ VIEW  COMPARISON:  None.  FINDINGS: There is no evidence of fracture or dislocation. There is no evidence of arthropathy or other focal bone abnormality. Soft tissues are unremarkable.  IMPRESSION: Negative exam.   Electronically Signed   By: Inge Rise M.D.   On: 09/06/2014 13:05     EKG Interpretation None      MDM   Final diagnoses:  Fall  Foot sprain, left, initial encounter    Medications  acetaminophen (TYLENOL) tablet 325 mg (not administered)    Filed Vitals:   09/06/14 1232 09/06/14 1332  BP: 131/74   Pulse: 98 97  Temp: 99.1 F (37.3 C)   TempSrc: Oral   Resp: 18   SpO2: 100% 100%   Plain film of left foot negative  for acute osseous injury. Plain film of left ankle no acute fracture dislocation. Negative signs of ischemia. Negative signs of compartment syndrome. Negative findings of septic joint. Pulses palpable and strong. Negative focal neurological deficits noted. Suspicion to be foot sprain or strain of the tendons. Patient placed in an Ace bandage and crutches given for comfort purposes. Patient stable, afebrile. Patient not septic appearing. Discharged patient. [facility] and [facility]. Discussed with patient to rest, ice, elevate-toes above nose. Discussed with patient to closely monitor symptoms and if symptoms are to worsen or change to report back to the ED - strict return instructions given.  Patient agreed to plan of care, understood, all questions answered.    Jamse Mead, PA-C 09/06/14 1339  Jamse Mead, PA-C 09/06/14 [address] PA-C 09/06/14 [place], MD 09/06/14 1434

## 2014-09-13 ENCOUNTER — Telehealth (HOSPITAL_BASED_OUTPATIENT_CLINIC_OR_DEPARTMENT_OTHER): Payer: Self-pay | Admitting: Emergency Medicine

## 2015-12-19 ENCOUNTER — Emergency Department (HOSPITAL_COMMUNITY)
Admission: EM | Admit: 2015-12-19 | Discharge: 2015-12-19 | Disposition: A | Discharge status: Home / Self Care | Payer: BLUE CROSS/BLUE SHIELD

## 2015-12-19 NOTE — ED Notes (Signed)
Pt states she is going to leave and follow up with the school nurse later today

## 2016-01-01 ENCOUNTER — Ambulatory Visit: Payer: Managed Care, Other (non HMO) | Admitting: Allergy and Immunology

## 2016-02-22 IMAGING — CR DG ANKLE COMPLETE 3+V*L*
3 series · 3 of 3 positions shown · non-contrast
Comparison: None.

ADDENDUM:
Dictation error:

Impression replacement:
No fracture or dislocation.
CLINICAL DATA: Pt was dancing with her [place] and injured Lt
foot/ankle. No prior injury to area, pt c/o pain on dorsal surface
of Lt foot towards toes and medial side of foot.
EXAM:
LEFT ANKLE COMPLETE - 3+ VIEW

[x ankle ap left]
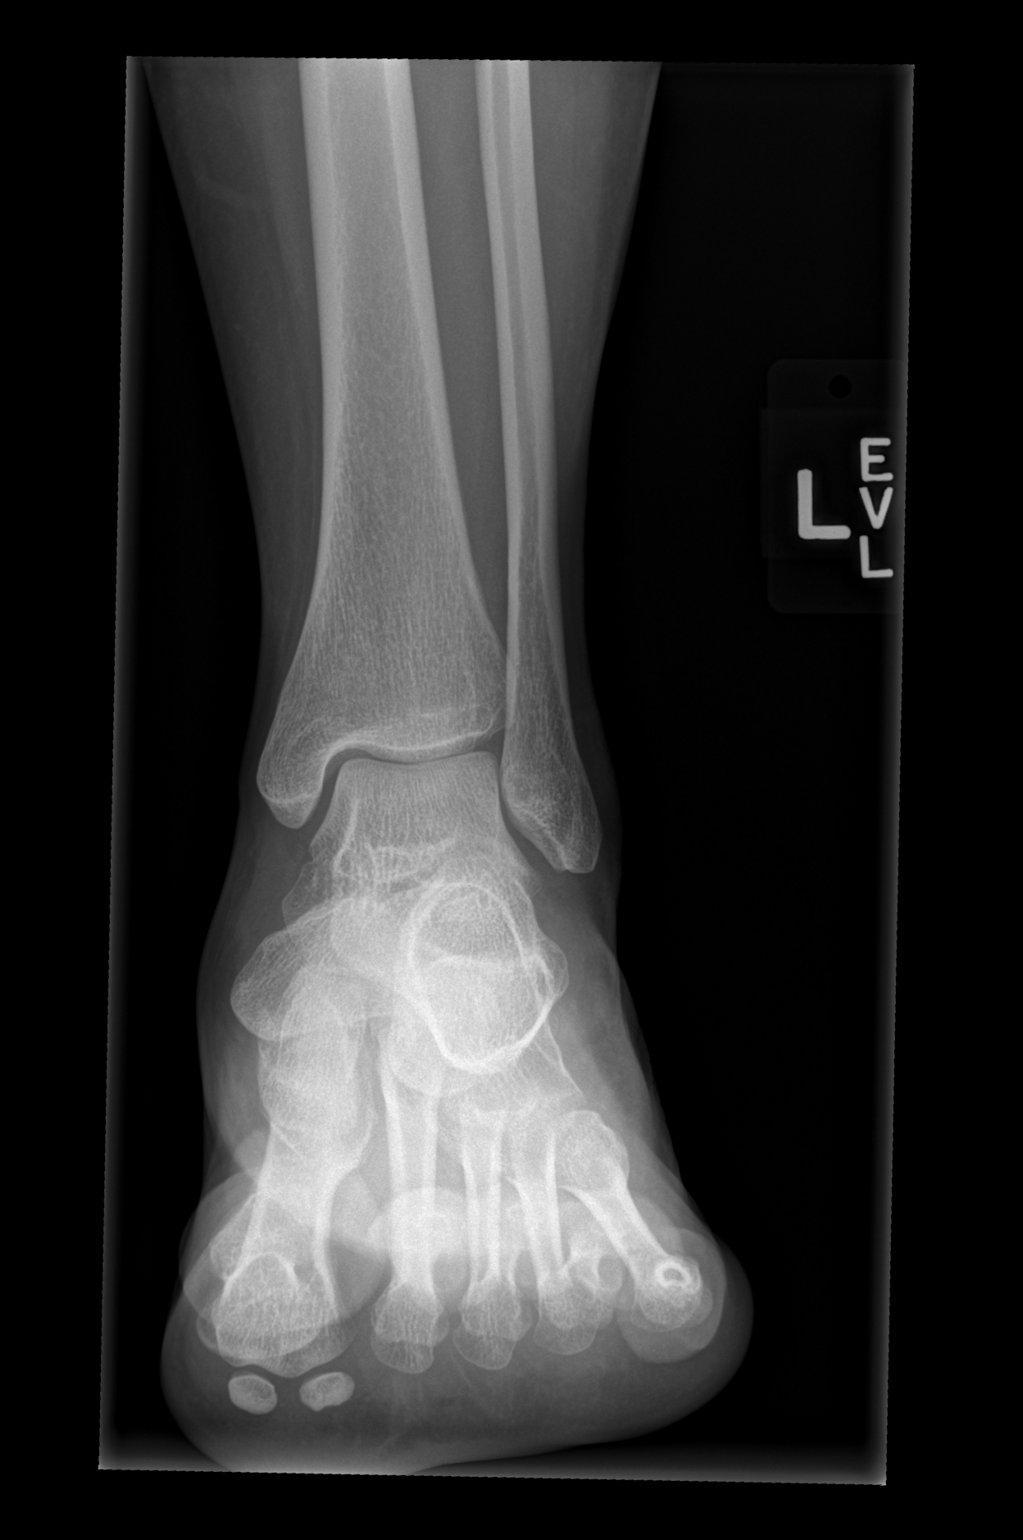

[x ankle obl left]
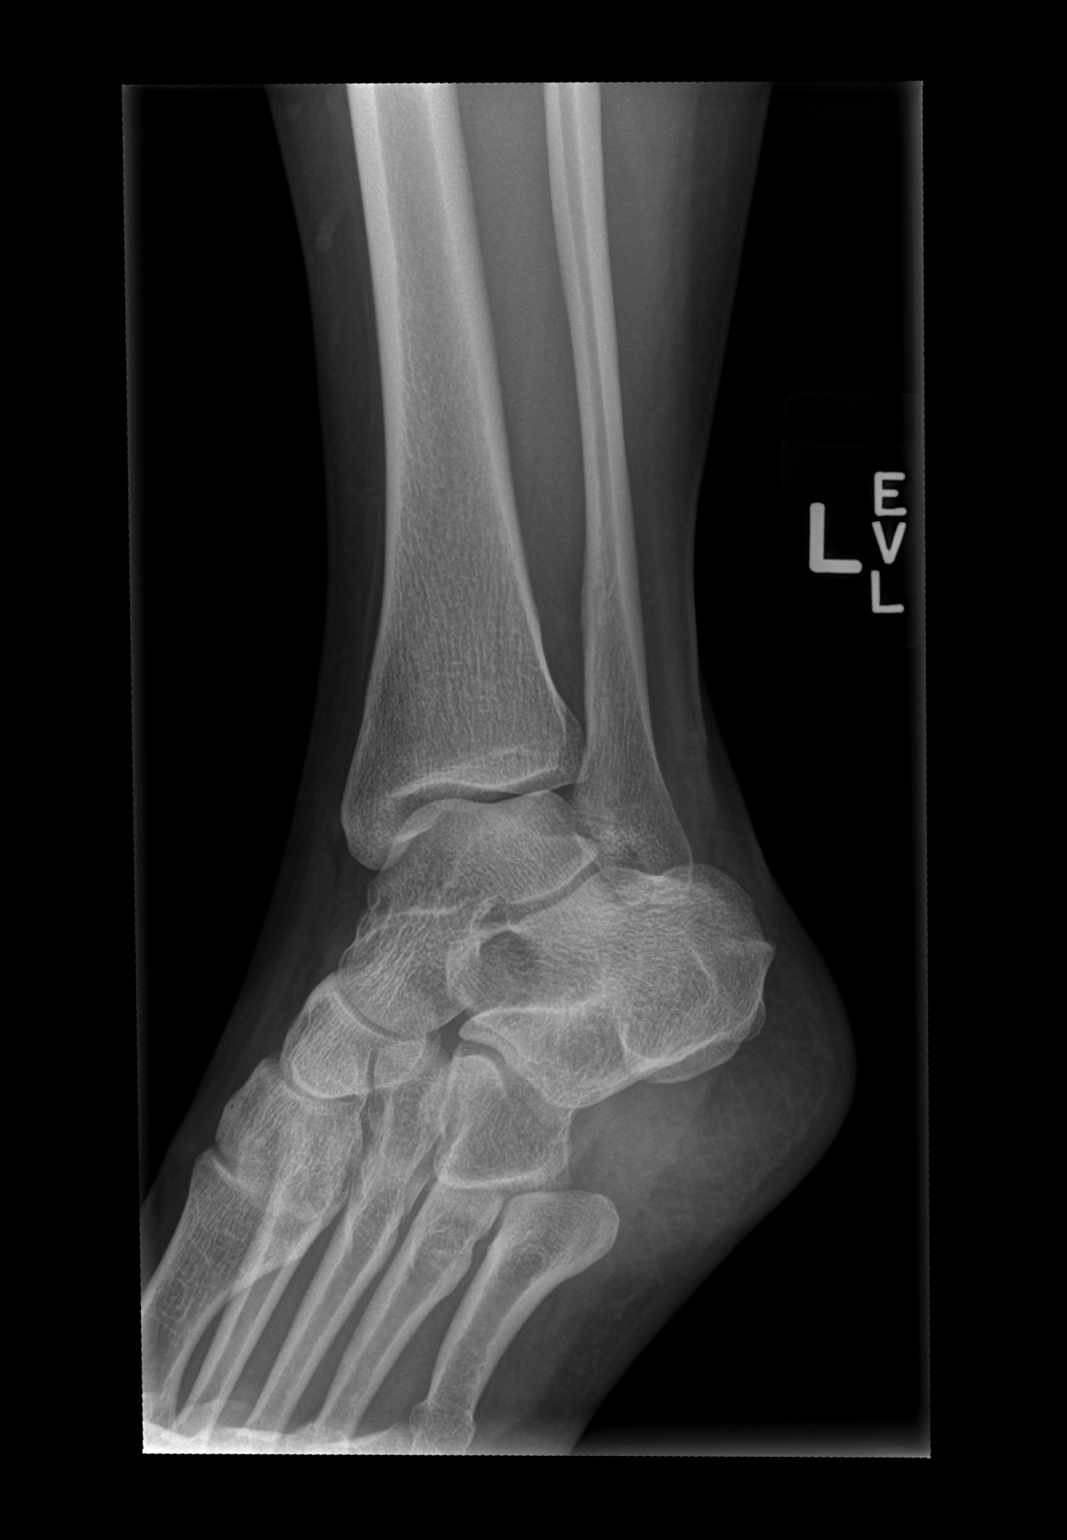

[x ankle lat left]
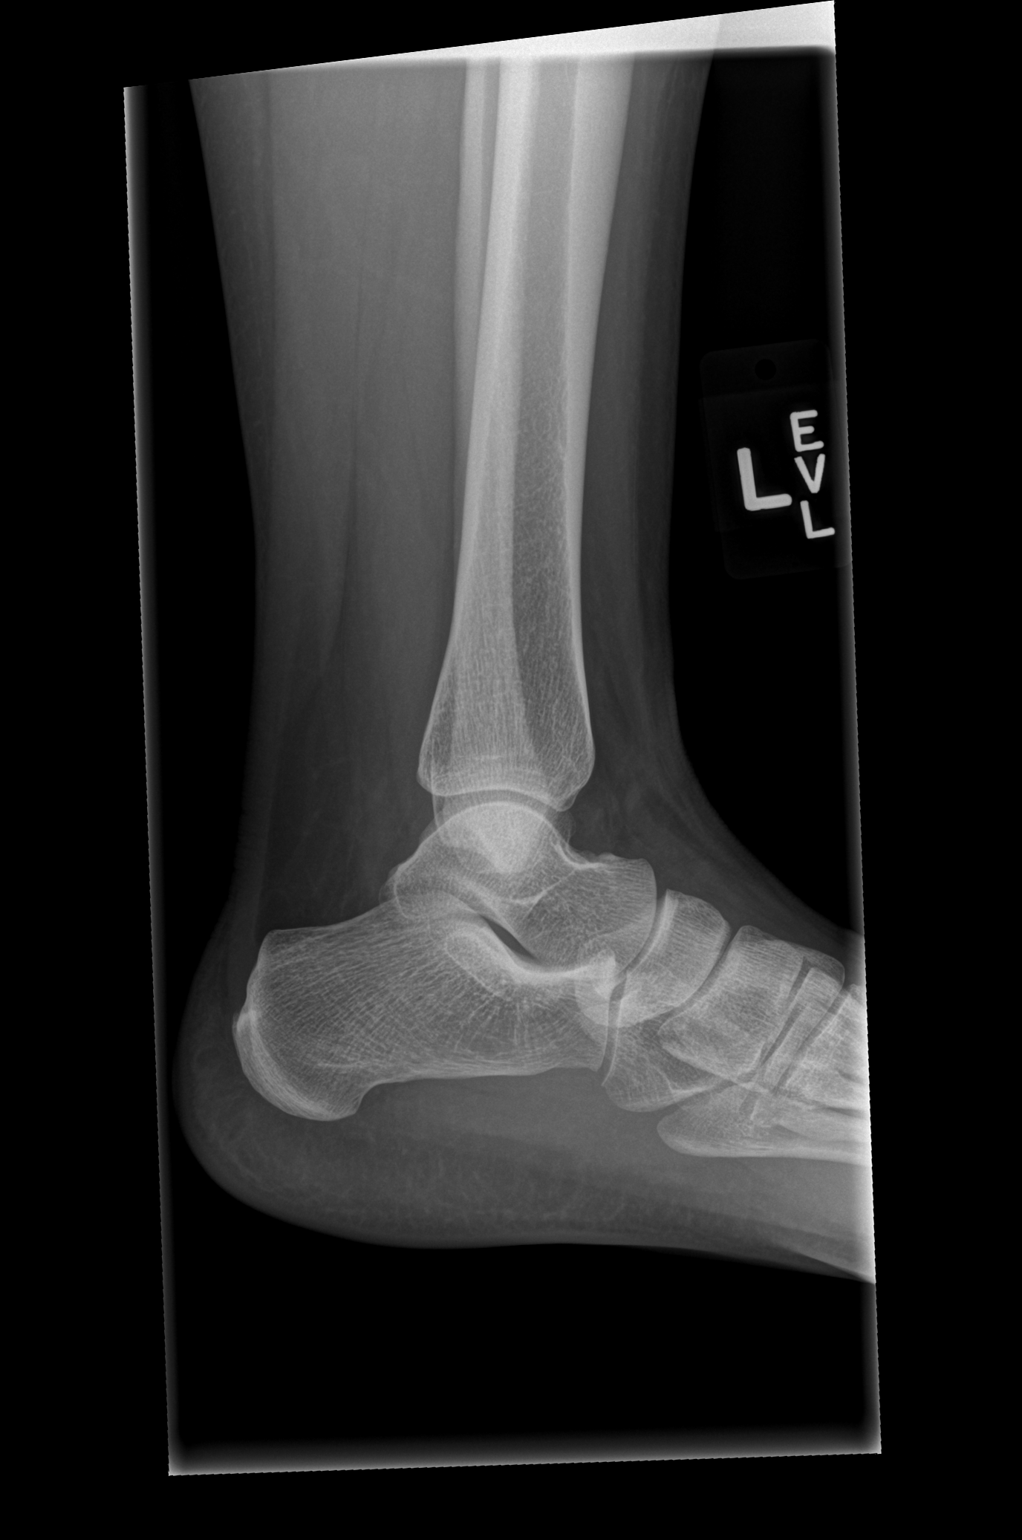

[3 of 3 positions shown; findings below may reference images not displayed]

FINDINGS: Ankle mortise intact. The talar dome is normal. No malleolar
fracture. The calcaneus is normal.
IMPRESSION: No acute cardiopulmonary process.

## 2019-10-26 ENCOUNTER — Other Ambulatory Visit: Payer: Self-pay

## 2019-10-26 ENCOUNTER — Ambulatory Visit: Payer: Managed Care, Other (non HMO) | Attending: Family

## 2019-12-04 ENCOUNTER — Other Ambulatory Visit: Payer: Self-pay

## 2019-12-04 ENCOUNTER — Ambulatory Visit: Payer: Managed Care, Other (non HMO) | Attending: Family

## 2019-12-04 DIAGNOSIS — Z23 Encounter for immunization: Secondary | ICD-10-CM | POA: Insufficient documentation

## 2019-12-04 NOTE — Progress Notes (Signed)
   Covid-19 Vaccination Clinic  Name:  Cassandra [name]    MRN: [id number] DOB: 11/27/1985  12/04/2019  [name] was observed post Covid-19 immunization for 15 minutes without incidence. She was provided with Vaccine Information Sheet and instruction to access the V-Safe system.   [name] was instructed to call 911 with any severe reactions post vaccine: Marland Kitchen Difficulty breathing  . Swelling of your face and throat  . A fast heartbeat  . A bad rash all over your body  . Dizziness and weakness    Immunizations Administered    Name Date Dose VIS Date Route   Moderna COVID-19 Vaccine 12/04/2019 12:24 PM 0.5 mL 09/12/2019 Intramuscular   Manufacturer: Moderna   Lot: [id number]   [id number]

## 2020-01-09 ENCOUNTER — Ambulatory Visit: Payer: Managed Care, Other (non HMO) | Attending: Family

## 2020-01-09 DIAGNOSIS — Z23 Encounter for immunization: Secondary | ICD-10-CM

## 2020-01-09 NOTE — Progress Notes (Signed)
   Covid-19 Vaccination Clinic  Name:  Cassandra [name]    MRN: [id number] DOB: 01-16-1986  01/09/2020  [name] was observed post Covid-19 immunization for 15 minutes without incident. She was provided with Vaccine Information Sheet and instruction to access the V-Safe system.   [name] was instructed to call 911 with any severe reactions post vaccine: Marland Kitchen Difficulty breathing  . Swelling of face and throat  . A fast heartbeat  . A bad rash all over body  . Dizziness and weakness   Immunizations Administered    Name Date Dose VIS Date Route   Moderna COVID-19 Vaccine 01/09/2020 10:07 AM 0.5 mL 09/12/2019 Intramuscular   Manufacturer: Moderna   LotFP:[id number]   [id number]

## 2020-04-19 ENCOUNTER — Ambulatory Visit
Admission: RE | Admit: 2020-04-19 | Discharge: 2020-04-19 | Disposition: A | Discharge status: Home / Self Care | Payer: Self-pay | Source: Ambulatory Visit | Attending: Physician Assistant | Admitting: Physician Assistant

## 2020-04-19 ENCOUNTER — Other Ambulatory Visit: Payer: Self-pay

## 2020-04-19 VITALS — BP 132/85 | HR 87 | Temp 97.9°F | Resp 18

## 2020-04-19 DIAGNOSIS — K0889 Other specified disorders of teeth and supporting structures: Secondary | ICD-10-CM

## 2020-04-19 MED ORDER — AMOXICILLIN-POT CLAVULANATE 875-125 MG PO TABS: 1.0000 | ORAL_TABLET | Freq: Two times a day (BID) | ORAL | 0 refills | Status: AC

## 2020-04-19 MED ORDER — TRAMADOL HCL 50 MG PO TABS: 50.0000 mg | ORAL_TABLET | Freq: Two times a day (BID) | ORAL | 0 refills | Status: DC | PRN

## 2020-04-19 NOTE — ED Triage Notes (Signed)
Pt presents to [facility] for assessment of right dental pain x 3 days.  Patient c/o difficulty eating and swallowing.

## 2020-04-19 NOTE — Discharge Instructions (Signed)
Start Augmentin as directed for dental infection. Ibuprofen 800mg  three times a day, tylenol 1000mg  three times a day. If still needing further pain relief, can add tramadol. Follow up with dentist for further treatment and evaluation. If experiencing swelling of the throat, trouble breathing, trouble swallowing, leaning forward to breath, drooling, go to the emergency department for further evaluation.

## 2020-04-19 NOTE — ED Notes (Signed)
Patient able to ambulate independently  
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
## 2022-07-21 ENCOUNTER — Other Ambulatory Visit: Payer: Self-pay

## 2022-07-21 ENCOUNTER — Emergency Department (HOSPITAL_COMMUNITY)
Admission: EM | Admit: 2022-07-21 | Discharge: 2022-07-21 | Disposition: A | Discharge status: Home / Self Care | Payer: Medicaid Other | Attending: Emergency Medicine | Admitting: Emergency Medicine

## 2022-07-21 ENCOUNTER — Encounter (HOSPITAL_COMMUNITY): Payer: Self-pay

## 2022-07-21 DIAGNOSIS — T310 Burns involving less than 10% of body surface: Secondary | ICD-10-CM | POA: Insufficient documentation

## 2022-07-21 DIAGNOSIS — X150XXA Contact with hot stove (kitchen), initial encounter: Secondary | ICD-10-CM | POA: Insufficient documentation

## 2022-07-21 DIAGNOSIS — T23252A Burn of second degree of left palm, initial encounter: Secondary | ICD-10-CM | POA: Insufficient documentation

## 2022-07-21 DIAGNOSIS — Y92000 Kitchen of unspecified non-institutional (private) residence as  the place of occurrence of the external cause: Secondary | ICD-10-CM | POA: Insufficient documentation

## 2022-07-21 MED ORDER — HYDROCODONE-ACETAMINOPHEN 5-325 MG PO TABS: 1.0000 | ORAL_TABLET | Freq: Four times a day (QID) | ORAL | 0 refills | Status: AC | PRN

## 2022-07-21 MED ORDER — BACITRACIN 500 UNIT/GM EX OINT
TOPICAL_OINTMENT | CUTANEOUS | Status: DC | PRN
  Filled 2022-07-21: qty 14

## 2022-07-21 MED ORDER — HYDROCODONE-ACETAMINOPHEN 5-325 MG PO TABS
1.0000 | ORAL_TABLET | Freq: Once | ORAL | Status: AC
  Administered 2022-07-21: 1 via ORAL
  Filled 2022-07-21: qty 1

## 2022-07-21 NOTE — Discharge Instructions (Signed)
Take Norco as prescribed for severe pain that is not controlled with 600 mg ibuprofen every 6 hours.  Apply large amounts of bacitracin to the wound area twice a day.  Keep it wrapped to prevent infection.  If blisters formed, try to keep these intact if able.  Follow-up with a primary care doctor for wound recheck in 1 week.  Return for new or concerning symptoms.
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
## 2022-07-21 NOTE — ED Notes (Signed)
Provided pt with extra wound care supplies, sterile non-adherant dressing, kerlix and tape. Instructed pt on directions for wound care, wrapping.
